# Patient Record
Sex: Female | Born: 1990 | Race: Black or African American | Hispanic: No | Marital: Single | Smoking: Never smoker
Health system: Southern US, Community
[De-identification: ages and names within clinical notes are randomized; demographics above are authoritative.]

## PROBLEM LIST (undated history)

## (undated) DIAGNOSIS — D649 Anemia, unspecified: Secondary | ICD-10-CM

## (undated) DIAGNOSIS — T7840XA Allergy, unspecified, initial encounter: Secondary | ICD-10-CM

## (undated) DIAGNOSIS — L309 Dermatitis, unspecified: Secondary | ICD-10-CM

## (undated) DIAGNOSIS — K589 Irritable bowel syndrome without diarrhea: Secondary | ICD-10-CM

## (undated) HISTORY — DX: Allergy, unspecified, initial encounter: T78.40XA

## (undated) HISTORY — DX: Irritable bowel syndrome, unspecified: K58.9

## (undated) HISTORY — PX: COLONOSCOPY: SHX174

## (undated) HISTORY — PX: WISDOM TOOTH EXTRACTION: SHX21

## (undated) HISTORY — DX: Anemia, unspecified: D64.9

## (undated) HISTORY — DX: Dermatitis, unspecified: L30.9

---
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
## 2013-02-21 ENCOUNTER — Encounter (HOSPITAL_BASED_OUTPATIENT_CLINIC_OR_DEPARTMENT_OTHER): Payer: Self-pay | Admitting: *Deleted

## 2013-02-21 ENCOUNTER — Emergency Department (HOSPITAL_BASED_OUTPATIENT_CLINIC_OR_DEPARTMENT_OTHER)
Admission: EM | Admit: 2013-02-21 | Discharge: 2013-02-21 | Disposition: A | Discharge status: Home / Self Care | Payer: No Typology Code available for payment source | Attending: Emergency Medicine | Admitting: Emergency Medicine

## 2013-02-21 ENCOUNTER — Emergency Department (HOSPITAL_BASED_OUTPATIENT_CLINIC_OR_DEPARTMENT_OTHER): Payer: No Typology Code available for payment source

## 2013-02-21 DIAGNOSIS — Y92410 Unspecified street and highway as the place of occurrence of the external cause: Secondary | ICD-10-CM | POA: Insufficient documentation

## 2013-02-21 DIAGNOSIS — S239XXA Sprain of unspecified parts of thorax, initial encounter: Secondary | ICD-10-CM | POA: Insufficient documentation

## 2013-02-21 DIAGNOSIS — Z79899 Other long term (current) drug therapy: Secondary | ICD-10-CM | POA: Insufficient documentation

## 2013-02-21 DIAGNOSIS — S29019A Strain of muscle and tendon of unspecified wall of thorax, initial encounter: Secondary | ICD-10-CM

## 2013-02-21 DIAGNOSIS — Y9389 Activity, other specified: Secondary | ICD-10-CM | POA: Insufficient documentation

## 2013-02-21 MED ORDER — HYDROMORPHONE HCL PF 1 MG/ML IJ SOLN
1.0000 mg | Freq: Once | INTRAMUSCULAR | Status: AC
  Administered 2013-02-21: 1 mg via INTRAVENOUS
  Filled 2013-02-21: qty 1

## 2013-02-21 MED ORDER — SODIUM CHLORIDE 0.9 % IV SOLN
INTRAVENOUS | Status: DC
  Administered 2013-02-21: 13:00:00 via INTRAVENOUS

## 2013-02-21 MED ORDER — OXYCODONE-ACETAMINOPHEN 5-325 MG PO TABS: 1.0000 | ORAL_TABLET | ORAL | Status: DC | PRN
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
## 2017-08-27 DIAGNOSIS — Z01419 Encounter for gynecological examination (general) (routine) without abnormal findings: Secondary | ICD-10-CM | POA: Diagnosis not present

## 2017-09-23 DIAGNOSIS — Z202 Contact with and (suspected) exposure to infections with a predominantly sexual mode of transmission: Secondary | ICD-10-CM | POA: Diagnosis not present

## 2017-09-28 DIAGNOSIS — A549 Gonococcal infection, unspecified: Secondary | ICD-10-CM | POA: Diagnosis not present

## 2017-10-26 DIAGNOSIS — Z202 Contact with and (suspected) exposure to infections with a predominantly sexual mode of transmission: Secondary | ICD-10-CM | POA: Diagnosis not present

## 2017-10-26 DIAGNOSIS — A549 Gonococcal infection, unspecified: Secondary | ICD-10-CM | POA: Diagnosis not present

## 2018-01-21 DIAGNOSIS — K5909 Other constipation: Secondary | ICD-10-CM | POA: Diagnosis not present

## 2018-01-21 DIAGNOSIS — R5383 Other fatigue: Secondary | ICD-10-CM | POA: Diagnosis not present

## 2018-01-21 DIAGNOSIS — R1031 Right lower quadrant pain: Secondary | ICD-10-CM | POA: Diagnosis not present

## 2018-01-21 DIAGNOSIS — R1032 Left lower quadrant pain: Secondary | ICD-10-CM | POA: Diagnosis not present

## 2018-01-22 DIAGNOSIS — K5909 Other constipation: Secondary | ICD-10-CM | POA: Diagnosis not present

## 2018-01-22 DIAGNOSIS — R5383 Other fatigue: Secondary | ICD-10-CM | POA: Diagnosis not present

## 2018-02-17 DIAGNOSIS — K5900 Constipation, unspecified: Secondary | ICD-10-CM | POA: Diagnosis not present

## 2018-02-17 DIAGNOSIS — K6289 Other specified diseases of anus and rectum: Secondary | ICD-10-CM | POA: Diagnosis not present

## 2018-02-17 DIAGNOSIS — D509 Iron deficiency anemia, unspecified: Secondary | ICD-10-CM | POA: Diagnosis not present

## 2018-02-17 DIAGNOSIS — K648 Other hemorrhoids: Secondary | ICD-10-CM | POA: Diagnosis not present

## 2018-02-17 DIAGNOSIS — K449 Diaphragmatic hernia without obstruction or gangrene: Secondary | ICD-10-CM | POA: Diagnosis not present

## 2018-02-17 DIAGNOSIS — K5909 Other constipation: Secondary | ICD-10-CM | POA: Diagnosis not present

## 2018-02-17 DIAGNOSIS — K639 Disease of intestine, unspecified: Secondary | ICD-10-CM | POA: Diagnosis not present

## 2018-02-17 DIAGNOSIS — R1030 Lower abdominal pain, unspecified: Secondary | ICD-10-CM | POA: Diagnosis not present

## 2018-02-24 DIAGNOSIS — Z30013 Encounter for initial prescription of injectable contraceptive: Secondary | ICD-10-CM | POA: Diagnosis not present

## 2018-02-24 DIAGNOSIS — N840 Polyp of corpus uteri: Secondary | ICD-10-CM | POA: Diagnosis not present

## 2018-02-24 DIAGNOSIS — N921 Excessive and frequent menstruation with irregular cycle: Secondary | ICD-10-CM | POA: Diagnosis not present

## 2018-02-24 DIAGNOSIS — N760 Acute vaginitis: Secondary | ICD-10-CM | POA: Diagnosis not present

## 2018-02-24 DIAGNOSIS — D500 Iron deficiency anemia secondary to blood loss (chronic): Secondary | ICD-10-CM | POA: Diagnosis not present

## 2018-02-24 DIAGNOSIS — Z30432 Encounter for removal of intrauterine contraceptive device: Secondary | ICD-10-CM | POA: Diagnosis not present

## 2018-02-24 DIAGNOSIS — B9689 Other specified bacterial agents as the cause of diseases classified elsewhere: Secondary | ICD-10-CM | POA: Diagnosis not present

## 2018-02-24 DIAGNOSIS — Z3202 Encounter for pregnancy test, result negative: Secondary | ICD-10-CM | POA: Diagnosis not present

## 2018-02-24 DIAGNOSIS — Z113 Encounter for screening for infections with a predominantly sexual mode of transmission: Secondary | ICD-10-CM | POA: Diagnosis not present

## 2018-02-24 DIAGNOSIS — T8332XA Displacement of intrauterine contraceptive device, initial encounter: Secondary | ICD-10-CM | POA: Diagnosis not present

## 2018-03-30 ENCOUNTER — Ambulatory Visit: Payer: BLUE CROSS/BLUE SHIELD | Admitting: Family Medicine

## 2018-03-30 ENCOUNTER — Encounter: Payer: Self-pay | Admitting: Family Medicine

## 2018-03-30 VITALS — BP 106/60 | HR 83 | Resp 16 | Ht 66.0 in | Wt 145.4 lb

## 2018-03-30 DIAGNOSIS — Z9109 Other allergy status, other than to drugs and biological substances: Secondary | ICD-10-CM

## 2018-03-30 DIAGNOSIS — D649 Anemia, unspecified: Secondary | ICD-10-CM | POA: Insufficient documentation

## 2018-03-30 DIAGNOSIS — Z0000 Encounter for general adult medical examination without abnormal findings: Secondary | ICD-10-CM | POA: Diagnosis not present

## 2018-03-30 DIAGNOSIS — D509 Iron deficiency anemia, unspecified: Secondary | ICD-10-CM | POA: Diagnosis not present

## 2018-03-30 DIAGNOSIS — K5900 Constipation, unspecified: Secondary | ICD-10-CM | POA: Insufficient documentation

## 2018-03-30 MED ORDER — FERROUS FUMARATE-FOLIC ACID 324-1 MG PO TABS: 1.0000 | ORAL_TABLET | Freq: Every day | ORAL | 3 refills | Status: DC

## 2018-03-30 MED ORDER — MONTELUKAST SODIUM 10 MG PO TABS: 10.0000 mg | ORAL_TABLET | Freq: Every day | ORAL | 3 refills | Status: DC

## 2018-03-30 NOTE — Patient Instructions (Addendum)
Miralax with Benefiber once or twice daily for constipation  Encouraged increased hydration and fiber in diet. Daily probiotics. If bowels not moving can use MOM 2 tbls po in 4 oz of warm prune juice by mouth every 2-3 days. If no results then repeat in 4 hours with  Dulcolax suppository pr, may repeat again in 4 more hours as needed. Seek care if symptoms worsen. Consider daily   [employer] for cast iron Patient seen with and examined with student.  Agree with documentation See separate note for further documentation Preventive Care 18-39 Years, Female Preventive care refers to lifestyle choices and visits with your health care provider that can promote health and wellness. What does preventive care include?  A yearly physical exam. This is also called an annual well check.  Dental exams once or twice a year.  Routine eye exams. Ask your health care provider how often you should have your eyes checked.  Personal lifestyle choices, including: ? Daily care of your teeth and gums. ? Regular physical activity. ? Eating a healthy diet. ? Avoiding tobacco and drug use. ? Limiting alcohol use. ? Practicing safe sex. ? Taking vitamin and mineral supplements as recommended by your health care provider. What happens during an annual well check? The services and screenings done by your health care provider during your annual well check will depend on your age, overall health, lifestyle risk factors, and family history of disease. Counseling Your health care provider may ask you questions about your:  Alcohol use.  Tobacco use.  Drug use.  Emotional well-being.  Home and relationship well-being.  Sexual activity.  Eating habits.  Work and work Statistician.  Method of birth control.  Menstrual cycle.  Pregnancy history.  Screening You may have the following tests or measurements:  Height, weight, and BMI.  Diabetes screening. This is done by checking your blood sugar  (glucose) after you have not eaten for a while (fasting).  Blood pressure.  Lipid and cholesterol levels. These may be checked every 5 years starting at age 13.  Skin check.  Hepatitis C blood test.  Hepatitis B blood test.  Sexually transmitted disease (STD) testing.  BRCA-related cancer screening. This may be done if you have a family history of breast, ovarian, tubal, or peritoneal cancers.  Pelvic exam and Pap test. This may be done every 3 years starting at age 67. Starting at age 55, this may be done every 5 years if you have a Pap test in combination with an HPV test.  Discuss your test results, treatment options, and if necessary, the need for more tests with your health care provider. Vaccines Your health care provider may recommend certain vaccines, such as:  Influenza vaccine. This is recommended every year.  Tetanus, diphtheria, and acellular pertussis (Tdap, Td) vaccine. You may need a Td booster every 10 years.  Varicella vaccine. You may need this if you have not been vaccinated.  HPV vaccine. If you are 49 or younger, you may need three doses over 6 months.  Measles, mumps, and rubella (MMR) vaccine. You may need at least one dose of MMR. You may also need a second dose.  Pneumococcal 13-valent conjugate (PCV13) vaccine. You may need this if you have certain conditions and were not previously vaccinated.  Pneumococcal polysaccharide (PPSV23) vaccine. You may need one or two doses if you smoke cigarettes or if you have certain conditions.  Meningococcal vaccine. One dose is recommended if you are age 73-21 years and a  first-year college student living in a residence hall, or if you have one of several medical conditions. You may also need additional booster doses.  Hepatitis A vaccine. You may need this if you have certain conditions or if you travel or work in places where you may be exposed to hepatitis A.  Hepatitis B vaccine. You may need this if you have  certain conditions or if you travel or work in places where you may be exposed to hepatitis B.  Haemophilus influenzae type b (Hib) vaccine. You may need this if you have certain risk factors.  Talk to your health care provider about which screenings and vaccines you need and how often you need them. This information is not intended to replace advice given to you by your health care provider. Make sure you discuss any questions you have with your health care provider. Document Released: 02/03/2002 Document Revised: 08/27/2016 Document Reviewed: 10/09/2015 Elsevier Interactive Patient Education  2018 [place] and/or Dulcolax if symptoms persist.

## 2018-03-30 NOTE — Assessment & Plan Note (Signed)
Chronic symptoms taking Claritin and Flonase. Can increase to twice daily, and/or change to Zyrtec, Allegra, Xyzal

## 2018-03-30 NOTE — Assessment & Plan Note (Addendum)
Improving, follows with OB/GYN care and has had a Depo Provera shot, start on Hemocyte F 1 tab

## 2018-04-03 DIAGNOSIS — Z0000 Encounter for general adult medical examination without abnormal findings: Secondary | ICD-10-CM | POA: Insufficient documentation

## 2018-04-03 NOTE — Assessment & Plan Note (Signed)
Encouraged increased hydration and fiber in diet. Daily probiotics. If bowels not moving can use MOM 2 tbls po in 4 oz of warm prune juice by mouth every 2-3 days. If no results then repeat in 4 hours with  Dulcolax suppository pr, may repeat again in 4 more hours as needed. Seek care if symptoms worsen. Consider daily Miralax and/or Dulcolax if symptoms persist.  

## 2018-04-03 NOTE — Progress Notes (Signed)
Patient ID: Kylie Lane, female   DOB: 1991-04-29, 27 y.o.   MRN: [id number]   Subjective:    Patient ID: Kylie Lane, female    DOB: [birth year], 27 y.o.   MRN: [id number]  Chief Complaint  Patient presents with  . New Patient (Initial Visit)    low iron, polyp on uterus  . Annual Exam    HPI Patient is in today for new patient appoinment. She has been struggling with significant anemia and has undergone endoscopy which was unremarkable and a GYN work up. No endometriosis or fibroids were identified. She was given a shot of Depo Provera and has only had one cycle which was only spotting. She was having very heavy bleeding before changing pads every hour for several days each cycle. She notes some ftigue but otherwise feels well today. Denies CP/palp/SOB/H/fevers or GU c/o. She notes some trouble with constipation and having to strain every few days. Notes some recent flare in head congestion as well due to spring allergies.  Past Medical History:  Diagnosis Date  . Allergy   . Anemia     Past Surgical History:  Procedure Laterality Date  . COLONOSCOPY    . WISDOM TOOTH EXTRACTION      Family History  Problem Relation Age of Onset  . Hyperlipidemia Mother   . Hypertension Mother   . Arthritis Maternal Grandmother   . Hyperlipidemia Maternal Grandmother   . Stroke Maternal Grandmother   . Hypertension Paternal Grandmother   . Constipation Father     Social History   Socioeconomic History  . Marital status: Single    Spouse name: Not on file  . Number of children: Not on file  . Years of education: Not on file  . Highest education level: Not on file  Occupational History  . Not on file  Social Needs  . Financial resource strain: Not on file  . Food insecurity:    Worry: Not on file    Inability: Not on file  . Transportation needs:    Medical: Not on file    Non-medical: Not on file  Tobacco Use  . Smoking status: Never Smoker  . Smokeless tobacco: Never  Used  Substance and Sexual Activity  . Alcohol use: Not on file  . Drug use: Never  . Sexual activity: Not on file  Lifestyle  . Physical activity:    Days per week: Not on file    Minutes per session: Not on file  . Stress: Not on file  Relationships  . Social connections:    Talks on phone: Not on file    Gets together: Not on file    Attends religious service: Not on file    Active member of club or organization: Not on file    Attends meetings of clubs or organizations: Not on file    Relationship status: Not on file  . Intimate partner violence:    Fear of current or ex partner: Not on file    Emotionally abused: Not on file    Physically abused: Not on file    Forced sexual activity: Not on file  Other Topics Concern  . Not on file  Social History Narrative   Lives with mother, works [employer], material tech,heart healthy diet, busy work, no regular exercise routine, wears seat belt, no tobacco, single     Outpatient Medications Prior to Visit  Medication Sig Dispense Refill  . fluticasone (FLONASE) 50 MCG/ACT nasal spray Place 1  spray into both nostrils daily.    Marland Kitchen loratadine (CLARITIN) 10 MG tablet Take 10 mg by mouth daily.    . medroxyPROGESTERone (DEPO-PROVERA) 150 MG/ML injection Inject into the muscle.    . Multiple Vitamin (MULTIVITAMIN) tablet Take 1 tablet by mouth daily.    . Polyethylene Glycol 3350 (MIRALAX PO) Take by mouth daily.    Marland Kitchen UNABLE TO FIND Med Name: Ferrofood bid    . fexofenadine (ALLEGRA) 180 MG tablet Take 180 mg by mouth daily.    Marland Kitchen oxyCODONE-acetaminophen (PERCOCET/ROXICET) 5-325 MG per tablet Take 1 tablet by mouth every 4 (four) hours as needed for pain. 15 tablet 0   No facility-administered medications prior to visit.     No Known Allergies  Review of Systems  Constitutional: Positive for malaise/fatigue. Negative for chills and fever.  HENT: Positive for congestion. Negative for hearing loss.   Eyes: Negative for discharge.    Respiratory: Negative for cough, sputum production and shortness of breath.   Cardiovascular: Negative for chest pain, palpitations and leg swelling.  Gastrointestinal: Positive for constipation. Negative for abdominal pain, blood in stool, diarrhea, heartburn, nausea and vomiting.  Genitourinary: Negative for dysuria, frequency, hematuria and urgency.  Musculoskeletal: Negative for back pain, falls and myalgias.  Skin: Negative for rash.  Neurological: Negative for dizziness, sensory change, loss of consciousness, weakness and headaches.  Endo/Heme/Allergies: Negative for environmental allergies. Does not bruise/bleed easily.  Psychiatric/Behavioral: Negative for depression and suicidal ideas. The patient is not nervous/anxious and does not have insomnia.        Objective:    Physical Exam  Constitutional: She is oriented to person, place, and time. No distress.  HENT:  Head: Normocephalic and atraumatic.  Right Ear: External ear normal.  Left Ear: External ear normal.  Nose: Nose normal.  Mouth/Throat: Oropharynx is clear and moist. No oropharyngeal exudate.  Eyes: Pupils are equal, round, and reactive to light. Conjunctivae are normal. Right eye exhibits no discharge. Left eye exhibits no discharge. No scleral icterus.  Neck: Normal range of motion. Neck supple. No thyromegaly present.  Cardiovascular: Normal rate, regular rhythm, normal heart sounds and intact distal pulses.  No murmur heard. Pulmonary/Chest: Effort normal and breath sounds normal. No respiratory distress. She has no wheezes. She has no rales.  Abdominal: Soft. Bowel sounds are normal. She exhibits no distension and no mass. There is no tenderness.  Musculoskeletal: Normal range of motion. She exhibits no edema or tenderness.  Lymphadenopathy:    She has no cervical adenopathy.  Neurological: She is alert and oriented to person, place, and time. She has normal reflexes. She displays normal reflexes. No cranial  nerve deficit. Coordination normal.  Skin: Skin is warm and dry. No rash noted. She is not diaphoretic.    BP 106/60 (BP Location: Right Arm, Patient Position: Sitting, Cuff Size: Normal)   Pulse 83   Resp 16   Ht 5\' 6"  (1.676 m)   Wt 145 lb 6.4 oz (66 kg)   SpO2 95%   BMI 23.47 kg/m         Assessment & Plan:   Problem List Items Addressed This Visit    Constipation    Encouraged increased hydration and fiber in diet. Daily probiotics. If bowels not moving can use MOM 2 tbls po in 4 oz of warm prune juice by mouth every 2-3 days. If no results then repeat in 4 hours with  Dulcolax suppository pr, may repeat again in 4 more hours as needed. Seek  care if symptoms worsen. Consider daily Miralax and/or Dulcolax if symptoms persist.       Environmental allergies    Chronic symptoms taking Claritin and Flonase. Can increase to twice daily, and/or change to Zyrtec, Allegra, Xyzal       Anemia    Improving, follows with OB/GYN care and has had a Depo Provera shot, start on Hemocyte F 1 tab       Relevant Medications   Ferrous Fumarate-Folic Acid (HEMOCYTE-F) 324-1 MG TABS   Other Relevant Orders   CBC   Preventative health care - Primary   Relevant Orders   Comprehensive metabolic panel   Lipid panel   TSH      I have discontinued Aurore L. Macdowell's fexofenadine and oxyCODONE-acetaminophen. I am also having her start on montelukast and Ferrous Fumarate-Folic Acid. Additionally, I am having her maintain her medroxyPROGESTERone, UNABLE TO FIND, multivitamin, Polyethylene Glycol 3350 (MIRALAX PO), fluticasone, and loratadine.  Meds ordered this encounter  Medications  . montelukast (SINGULAIR) 10 MG tablet    Sig: Take 1 tablet (10 mg total) by mouth at bedtime.    Dispense:  30 tablet    Refill:  3  . Ferrous Fumarate-Folic Acid (HEMOCYTE-F) 324-1 MG TABS    Sig: Take 1 tablet by mouth daily.    Dispense:  30 each    Refill:  3   CMA served as scribe during this  visit. History, Physical and Plan performed by medical provider. Documentation and orders reviewed and attested to.  Danise EdgeStacey Clotilde Loth, MD

## 2018-04-05 ENCOUNTER — Other Ambulatory Visit: Payer: BLUE CROSS/BLUE SHIELD

## 2018-04-23 ENCOUNTER — Other Ambulatory Visit (INDEPENDENT_AMBULATORY_CARE_PROVIDER_SITE_OTHER): Payer: BLUE CROSS/BLUE SHIELD

## 2018-04-23 DIAGNOSIS — D649 Anemia, unspecified: Secondary | ICD-10-CM

## 2018-04-23 DIAGNOSIS — K625 Hemorrhage of anus and rectum: Secondary | ICD-10-CM

## 2018-04-23 DIAGNOSIS — Z0000 Encounter for general adult medical examination without abnormal findings: Secondary | ICD-10-CM | POA: Diagnosis not present

## 2018-04-23 LAB — CBC
HEMATOCRIT: 32.4 % — AB (ref 35.0–45.0)
HEMOGLOBIN: 10.4 g/dL — AB (ref 11.7–15.5)
MCH: 25.9 pg — AB (ref 27.0–33.0)
MCHC: 32.1 g/dL (ref 32.0–36.0)
MCV: 80.6 fL (ref 80.0–100.0)
MPV: 10.5 fL (ref 7.5–12.5)
Platelets: 284 10*3/uL (ref 140–400)
RBC: 4.02 10*6/uL (ref 3.80–5.10)
RDW: 21.6 % — ABNORMAL HIGH (ref 11.0–15.0)
WBC: 5.6 10*3/uL (ref 3.8–10.8)

## 2018-04-23 LAB — TSH: TSH: 0.65 mIU/L

## 2018-04-24 LAB — COMPREHENSIVE METABOLIC PANEL
AG Ratio: 1.6 (calc) (ref 1.0–2.5)
ALBUMIN MSPROF: 4.4 g/dL (ref 3.6–5.1)
ALKALINE PHOSPHATASE (APISO): 36 U/L (ref 33–115)
ALT: 6 U/L (ref 6–29)
AST: 11 U/L (ref 10–30)
BUN: 8 mg/dL (ref 7–25)
CALCIUM: 9.2 mg/dL (ref 8.6–10.2)
CO2: 23 mmol/L (ref 20–32)
Chloride: 108 mmol/L (ref 98–110)
Creat: 0.65 mg/dL (ref 0.50–1.10)
Globulin: 2.8 g/dL (calc) (ref 1.9–3.7)
Glucose, Bld: 83 mg/dL (ref 65–99)
POTASSIUM: 3.8 mmol/L (ref 3.5–5.3)
Sodium: 139 mmol/L (ref 135–146)
Total Bilirubin: 0.3 mg/dL (ref 0.2–1.2)
Total Protein: 7.2 g/dL (ref 6.1–8.1)

## 2018-04-24 LAB — LIPID PANEL
CHOL/HDL RATIO: 2.6 (calc) (ref <5.0)
Cholesterol: 153 mg/dL (ref <200)
HDL: 59 mg/dL (ref >50)
LDL CHOLESTEROL (CALC): 83 mg/dL
Non-HDL Cholesterol (Calc): 94 mg/dL (calc) (ref <130)
Triglycerides: 39 mg/dL (ref <150)

## 2018-04-27 NOTE — Progress Notes (Signed)
Ordered ifob cards sent to patient

## 2018-04-27 NOTE — Addendum Note (Signed)
Addended by: Crissie Sickles A on: 04/27/2018 07:30 AM   Modules accepted: Orders

## 2018-05-06 DIAGNOSIS — K5909 Other constipation: Secondary | ICD-10-CM | POA: Diagnosis not present

## 2018-06-07 DIAGNOSIS — Z3202 Encounter for pregnancy test, result negative: Secondary | ICD-10-CM | POA: Diagnosis not present

## 2018-06-07 DIAGNOSIS — Z3042 Encounter for surveillance of injectable contraceptive: Secondary | ICD-10-CM | POA: Diagnosis not present

## 2018-06-17 DIAGNOSIS — Z114 Encounter for screening for human immunodeficiency virus [HIV]: Secondary | ICD-10-CM | POA: Diagnosis not present

## 2018-06-17 DIAGNOSIS — Z01419 Encounter for gynecological examination (general) (routine) without abnormal findings: Secondary | ICD-10-CM | POA: Diagnosis not present

## 2018-06-17 DIAGNOSIS — Z01411 Encounter for gynecological examination (general) (routine) with abnormal findings: Secondary | ICD-10-CM | POA: Diagnosis not present

## 2018-06-17 DIAGNOSIS — Z1151 Encounter for screening for human papillomavirus (HPV): Secondary | ICD-10-CM | POA: Diagnosis not present

## 2018-06-17 DIAGNOSIS — N840 Polyp of corpus uteri: Secondary | ICD-10-CM | POA: Diagnosis not present

## 2018-06-17 DIAGNOSIS — Z3042 Encounter for surveillance of injectable contraceptive: Secondary | ICD-10-CM | POA: Diagnosis not present

## 2018-07-05 ENCOUNTER — Ambulatory Visit: Payer: BLUE CROSS/BLUE SHIELD | Admitting: Family Medicine

## 2018-07-12 ENCOUNTER — Ambulatory Visit: Payer: BLUE CROSS/BLUE SHIELD | Admitting: Family Medicine

## 2018-07-12 ENCOUNTER — Encounter: Payer: Self-pay | Admitting: Family Medicine

## 2018-07-12 VITALS — BP 96/60 | HR 93 | Temp 98.6°F | Resp 18 | Ht 66.0 in | Wt 145.6 lb

## 2018-07-12 DIAGNOSIS — K5900 Constipation, unspecified: Secondary | ICD-10-CM

## 2018-07-12 DIAGNOSIS — D509 Iron deficiency anemia, unspecified: Secondary | ICD-10-CM

## 2018-07-12 NOTE — Patient Instructions (Signed)
Anemia Anemia is a condition in which you do not have enough red blood cells or hemoglobin. Hemoglobin is a substance in red blood cells that carries oxygen. When you do not have enough red blood cells or hemoglobin (are anemic), your body cannot get enough oxygen and your organs may not work properly. As a result, you may feel very tired or have other problems. What are the causes? Common causes of anemia include:  Excessive bleeding. Anemia can be caused by excessive bleeding inside or outside the body, including bleeding from the intestine or from periods in women.  Poor nutrition.  Long-lasting (chronic) kidney, thyroid, and liver disease.  Bone marrow disorders.  Cancer and treatments for cancer.  HIV (human immunodeficiency virus) and AIDS (acquired immunodeficiency syndrome).  Treatments for HIV and AIDS.  Spleen problems.  Blood disorders.  Infections, medicines, and autoimmune disorders that destroy red blood cells.  What are the signs or symptoms? Symptoms of this condition include:  Minor weakness.  Dizziness.  Headache.  Feeling heartbeats that are irregular or faster than normal (palpitations).  Shortness of breath, especially with exercise.  Paleness.  Cold sensitivity.  Indigestion.  Nausea.  Difficulty sleeping.  Difficulty concentrating.  Symptoms may occur suddenly or develop slowly. If your anemia is mild, you may not have symptoms. How is this diagnosed? This condition is diagnosed based on:  Blood tests.  Your medical history.  A physical exam.  Bone marrow biopsy.  Your health care provider may also check your stool (feces) for blood and may do additional testing to look for the cause of your bleeding. You may also have other tests, including:  Imaging tests, such as a CT scan or MRI.  Endoscopy.  Colonoscopy.  How is this treated? Treatment for this condition depends on the cause. If you continue to lose a lot of blood,  you may need to be treated at a hospital. Treatment may include:  Taking supplements of iron, vitamin B12, or folic acid.  Taking a hormone medicine (erythropoietin) that can help to stimulate red blood cell growth.  Having a blood transfusion. This may be needed if you lose a lot of blood.  Making changes to your diet.  Having surgery to remove your spleen.  Follow these instructions at home:  Take over-the-counter and prescription medicines only as told by your health care provider.  Take supplements only as told by your health care provider.  Follow any diet instructions that you were given.  Keep all follow-up visits as told by your health care provider. This is important. Contact a health care provider if:  You develop new bleeding anywhere in the body. Get help right away if:  You are very weak.  You are short of breath.  You have pain in your abdomen or chest.  You are dizzy or feel faint.  You have trouble concentrating.  You have bloody or black, tarry stools.  You vomit repeatedly or you vomit up blood. Summary  Anemia is a condition in which you do not have enough red blood cells or enough of a substance in your red blood cells that carries oxygen (hemoglobin).  Symptoms may occur suddenly or develop slowly.  If your anemia is mild, you may not have symptoms.  This condition is diagnosed with blood tests as well as a medical history and physical exam. Other tests may be needed.  Treatment for this condition depends on the cause of the anemia. This information is not intended to replace advice   given to you by your health care provider. Make sure you discuss any questions you have with your health care provider. Document Released: 01/15/2005 Document Revised: 01/09/2017 Document Reviewed: 01/09/2017 Elsevier Interactive Patient Education  [employer].

## 2018-07-12 NOTE — Progress Notes (Signed)
Subjective:  I acted as a Neurosurgeon for Dr. Abner Greenspan. [place], [place]  Patient ID: Kylie Lane, female    DOB: 03-18-1991, 27 y.o.   MRN: [id number]  No chief complaint on file.   HPI  Patient is in today for a 3 month follow up she has no acute concerns. No recent febrile illness or acute hospitalizations. Denies CP/palp/SOB/HA/congestion/fevers/GI or GU c/o. Taking meds as prescribed. She is following with GYN now and her menstrual bleeding is less. She is less tired and continues to work. Her constipation and allergies are well managed at the present time. Denies CP/palp/SOB/HA/congestion/fevers/GI or GU c/o. Taking meds as prescribed   Patient Care Team: Bradd Canary, MD as PCP - General (Family Medicine)   Past Medical History:  Diagnosis Date  . Allergy   . Anemia     Past Surgical History:  Procedure Laterality Date  . COLONOSCOPY    . WISDOM TOOTH EXTRACTION      Family History  Problem Relation Age of Onset  . Hyperlipidemia Mother   . Hypertension Mother   . Arthritis Maternal Grandmother   . Hyperlipidemia Maternal Grandmother   . Stroke Maternal Grandmother   . Hypertension Paternal Grandmother   . Constipation Father     Social History   Socioeconomic History  . Marital status: Single    Spouse name: Not on file  . Number of children: Not on file  . Years of education: Not on file  . Highest education level: Not on file  Occupational History  . Not on file  Social Needs  . Financial resource strain: Not on file  . Food insecurity:    Worry: Not on file    Inability: Not on file  . Transportation needs:    Medical: Not on file    Non-medical: Not on file  Tobacco Use  . Smoking status: Never Smoker  . Smokeless tobacco: Never Used  Substance and Sexual Activity  . Alcohol use: Not on file  . Drug use: Never  . Sexual activity: Not on file  Lifestyle  . Physical activity:    Days per week: Not on file    Minutes per session: Not on file    . Stress: Not on file  Relationships  . Social connections:    Talks on phone: Not on file    Gets together: Not on file    Attends religious service: Not on file    Active member of club or organization: Not on file    Attends meetings of clubs or organizations: Not on file    Relationship status: Not on file  . Intimate partner violence:    Fear of current or ex partner: Not on file    Emotionally abused: Not on file    Physically abused: Not on file    Forced sexual activity: Not on file  Other Topics Concern  . Not on file  Social History Narrative   Lives with mother, works [employer], material tech,heart healthy diet, busy work, no regular exercise routine, wears seat belt, no tobacco, single     Outpatient Medications Prior to Visit  Medication Sig Dispense Refill  . Ferrous Fumarate-Folic Acid (HEMOCYTE-F) 324-1 MG TABS Take 1 tablet by mouth daily. 30 each 3  . loratadine (CLARITIN) 10 MG tablet Take 10 mg by mouth daily.    . medroxyPROGESTERone (DEPO-PROVERA) 150 MG/ML injection Inject into the muscle.    . montelukast (SINGULAIR) 10 MG tablet Take 1 tablet (  10 mg total) by mouth at bedtime. 30 tablet 3  . Multiple Vitamin (MULTIVITAMIN) tablet Take 1 tablet by mouth daily.    . Polyethylene Glycol 3350 (MIRALAX PO) Take by mouth daily.    . Probiotic Product (PROBIOTIC ADVANCED PO) Take by mouth.    Marland Kitchen UNABLE TO FIND Med Name: Ferrofood bid    . fluticasone (FLONASE) 50 MCG/ACT nasal spray Place 1 spray into both nostrils daily.     No facility-administered medications prior to visit.     No Known Allergies  Review of Systems  Constitutional: Negative for fever and malaise/fatigue.  HENT: Negative for congestion.   Eyes: Negative for blurred vision.  Respiratory: Negative for cough and shortness of breath.   Cardiovascular: Negative for chest pain, palpitations and leg swelling.  Gastrointestinal: Negative for vomiting.  Musculoskeletal: Negative for back  pain.  Skin: Negative for rash.  Neurological: Negative for loss of consciousness and headaches.       Objective:    Physical Exam  Constitutional: She is oriented to person, place, and time. She appears well-developed and well-nourished. No distress.  HENT:  Head: Normocephalic and atraumatic.  Eyes: Conjunctivae are normal.  Neck: Normal range of motion. No thyromegaly present.  Cardiovascular: Normal rate and regular rhythm.  Pulmonary/Chest: Effort normal and breath sounds normal. She has no wheezes.  Abdominal: Soft. Bowel sounds are normal. There is no tenderness.  Musculoskeletal: Normal range of motion. She exhibits no edema or deformity.  Neurological: She is alert and oriented to person, place, and time.  Skin: Skin is warm and dry. She is not diaphoretic.  Psychiatric: She has a normal mood and affect.    BP 96/60 (BP Location: Left Arm, Patient Position: Sitting, Cuff Size: Normal)   Pulse 93   Temp 98.6 F (37 C) (Oral)   Resp 18   Ht 5\' 6"  (1.676 m)   Wt 145 lb 9.6 oz (66 kg)   SpO2 98%   BMI 23.50 kg/m  Wt Readings from Last 3 Encounters:  07/12/18 145 lb 9.6 oz (66 kg)  03/30/18 145 lb 6.4 oz (66 kg)  02/21/13 145 lb (65.8 kg) (76 %, Z= 0.70)*   * Growth percentiles are based on CDC (Girls, 2-20 Years) data.   BP Readings from Last 3 Encounters:  07/12/18 96/60  03/30/18 106/60  02/21/13 124/79      There is no immunization history on file for this patient.  Health Maintenance  Topic Date Due  . HIV Screening  07/19/2008  . TETANUS/TDAP  10/07/2017  . INFLUENZA VACCINE  07/22/2018  . PAP SMEAR  02/20/2020    Lab Results  Component Value Date   WBC 5.6 04/23/2018   HGB 10.4 (L) 04/23/2018   HCT 32.4 (L) 04/23/2018   PLT 284 04/23/2018   GLUCOSE 83 04/23/2018   CHOL 153 04/23/2018   TRIG 39 04/23/2018   HDL 59 04/23/2018   LDLCALC 83 04/23/2018   ALT 6 04/23/2018   AST 11 04/23/2018   NA 139 04/23/2018   K 3.8 04/23/2018   CL 108  04/23/2018   CREATININE 0.65 04/23/2018   BUN 8 04/23/2018   CO2 23 04/23/2018   TSH 0.65 04/23/2018    Lab Results  Component Value Date   TSH 0.65 04/23/2018   Lab Results  Component Value Date   WBC 5.6 04/23/2018   HGB 10.4 (L) 04/23/2018   HCT 32.4 (L) 04/23/2018   MCV 80.6 04/23/2018   PLT 284 04/23/2018  Lab Results  Component Value Date   NA 139 04/23/2018   K 3.8 04/23/2018   CO2 23 04/23/2018   GLUCOSE 83 04/23/2018   BUN 8 04/23/2018   CREATININE 0.65 04/23/2018   BILITOT 0.3 04/23/2018   AST 11 04/23/2018   ALT 6 04/23/2018   PROT 7.2 04/23/2018   CALCIUM 9.2 04/23/2018   Lab Results  Component Value Date   CHOL 153 04/23/2018   Lab Results  Component Value Date   HDL 59 04/23/2018   Lab Results  Component Value Date   LDLCALC 83 04/23/2018   Lab Results  Component Value Date   TRIG 39 04/23/2018   Lab Results  Component Value Date   CHOLHDL 2.6 04/23/2018   No results found for: HGBA1C       Assessment & Plan:   Problem List Items Addressed This Visit    None      I have discontinued Quinley L. Salsgiver's fluticasone. I am also having her maintain her medroxyPROGESTERone, UNABLE TO FIND, multivitamin, Polyethylene Glycol 3350 (MIRALAX PO), loratadine, montelukast, Ferrous Fumarate-Folic Acid, and Probiotic Product (PROBIOTIC ADVANCED PO).  No orders of the defined types were placed in this encounter.  CMA served as Neurosurgeonscribe during this visit. History, Physical and Plan performed by medical provider. Documentation and orders reviewed and attested to.  Crissie SicklesPrincess [place], [place]

## 2018-07-12 NOTE — Assessment & Plan Note (Signed)
Encouraged increased hydration and fiber in diet. Daily probiotics. If bowels not moving can use MOM 2 tbls po in 4 oz of warm prune juice by mouth every 2-3 days.  

## 2018-07-12 NOTE — Assessment & Plan Note (Signed)
Is feeling much better and less tired. Continues to take iron daily and is established with GYN now and her bleeding is controlled now. Repeat cbc with diff today

## 2018-07-13 LAB — CBC WITH DIFFERENTIAL/PLATELET
BASOS ABS: 0.1 10*3/uL (ref 0.0–0.1)
Basophils Relative: 0.8 % (ref 0.0–3.0)
Eosinophils Absolute: 0.1 10*3/uL (ref 0.0–0.7)
Eosinophils Relative: 1.6 % (ref 0.0–5.0)
HCT: 35.1 % — ABNORMAL LOW (ref 36.0–46.0)
Hemoglobin: 11.8 g/dL — ABNORMAL LOW (ref 12.0–15.0)
Lymphocytes Relative: 28.2 % (ref 12.0–46.0)
Lymphs Abs: 2 10*3/uL (ref 0.7–4.0)
MCHC: 33.7 g/dL (ref 30.0–36.0)
MCV: 90.5 fl (ref 78.0–100.0)
MONOS PCT: 7.6 % (ref 3.0–12.0)
Monocytes Absolute: 0.5 10*3/uL (ref 0.1–1.0)
NEUTROS PCT: 61.8 % (ref 43.0–77.0)
Neutro Abs: 4.3 10*3/uL (ref 1.4–7.7)
Platelets: 247 10*3/uL (ref 150.0–400.0)
RBC: 3.88 Mil/uL (ref 3.87–5.11)
RDW: 14.4 % (ref 11.5–15.5)
WBC: 6.9 10*3/uL (ref 4.0–10.5)

## 2018-09-07 DIAGNOSIS — N938 Other specified abnormal uterine and vaginal bleeding: Secondary | ICD-10-CM | POA: Diagnosis not present

## 2018-09-07 DIAGNOSIS — N840 Polyp of corpus uteri: Secondary | ICD-10-CM | POA: Diagnosis not present

## 2018-11-16 ENCOUNTER — Ambulatory Visit: Payer: BLUE CROSS/BLUE SHIELD | Admitting: Family Medicine

## 2018-11-16 ENCOUNTER — Encounter: Payer: Self-pay | Admitting: Family Medicine

## 2018-11-16 VITALS — BP 119/65 | HR 85 | Temp 98.4°F | Resp 18 | Wt 153.0 lb

## 2018-11-16 DIAGNOSIS — K5900 Constipation, unspecified: Secondary | ICD-10-CM | POA: Diagnosis not present

## 2018-11-16 DIAGNOSIS — D649 Anemia, unspecified: Secondary | ICD-10-CM | POA: Diagnosis not present

## 2018-11-16 DIAGNOSIS — R109 Unspecified abdominal pain: Secondary | ICD-10-CM

## 2018-11-16 NOTE — Patient Instructions (Addendum)
Encouraged increased hydration and fiber in diet. Daily probiotics. If bowels not moving can use MOM 2 tbls po in 4 oz of warm prune juice by mouth every 2-3 days. If no results then repeat in 4 hours with  Dulcolax suppository pr, may repeat again in 4 more hours as needed. Seek care if symptoms worsen. Consider daily Miralax and/or Dulcolax if symptoms persist.   Miralax with Benefiber once or twice daily   Round up/lyphosate may contribute to gastrointestinal distress. Consider trying to eat non GMO or organic foods.

## 2018-11-17 LAB — CBC WITH DIFFERENTIAL/PLATELET
BASOS ABS: 0.1 10*3/uL (ref 0.0–0.1)
Basophils Relative: 1 % (ref 0.0–3.0)
Eosinophils Absolute: 0.2 10*3/uL (ref 0.0–0.7)
Eosinophils Relative: 3 % (ref 0.0–5.0)
HCT: 33.5 % — ABNORMAL LOW (ref 36.0–46.0)
Hemoglobin: 11.5 g/dL — ABNORMAL LOW (ref 12.0–15.0)
LYMPHS ABS: 2.3 10*3/uL (ref 0.7–4.0)
Lymphocytes Relative: 33.9 % (ref 12.0–46.0)
MCHC: 34.2 g/dL (ref 30.0–36.0)
MCV: 89.8 fl (ref 78.0–100.0)
MONO ABS: 0.4 10*3/uL (ref 0.1–1.0)
Monocytes Relative: 6.5 % (ref 3.0–12.0)
NEUTROS PCT: 55.6 % (ref 43.0–77.0)
Neutro Abs: 3.8 10*3/uL (ref 1.4–7.7)
Platelets: 288 10*3/uL (ref 150.0–400.0)
RBC: 3.73 Mil/uL — AB (ref 3.87–5.11)
RDW: 13 % (ref 11.5–15.5)
WBC: 6.8 10*3/uL (ref 4.0–10.5)

## 2018-11-19 LAB — FOOD ALLERGY PROFILE
ALMONDS: 0.1 kU/L — AB
Allergen, Salmon, f41: <0.1 kU/L
CLASS: 0
CLASS: 0
CLASS: 0
CLASS: 0
CLASS: 1
CLASS: 2
CLASS: 0
CLASS: 0
CLASS: 0
CLASS: 0
CLASS: 1
CLASS: 2
CLASS: 2
Cashew IgE: <0.1 kU/L
Class: 0
Class: 2
Fish Cod: <0.1 kU/L
Hazelnut: 0.21 kU/L — ABNORMAL HIGH
Peanut IgE: 0.82 kU/L — ABNORMAL HIGH
SESAME SEED IGE: 3.41 kU/L — AB
SHRIMP IGE: 1.99 kU/L — AB
Scallop IgE: 0.52 kU/L — ABNORMAL HIGH
Soybean IgE: 0.76 kU/L — ABNORMAL HIGH
Walnut: 0.17 kU/L — ABNORMAL HIGH
Wheat IgE: 0.62 kU/L — ABNORMAL HIGH

## 2018-11-19 LAB — INTERPRETATION:

## 2018-11-22 DIAGNOSIS — R109 Unspecified abdominal pain: Secondary | ICD-10-CM | POA: Insufficient documentation

## 2018-11-22 NOTE — Assessment & Plan Note (Signed)
Intermittent abdominal pain after eating. Will proceed with some food allergy testing. Avoid offending foods and report if symptoms worsen.

## 2018-11-22 NOTE — Progress Notes (Signed)
Subjective:    Patient ID: Kylie Lane, female    DOB: 09-03-1991, 27 y.o.   MRN: [id number]  No chief complaint on file.   HPI Patient is in today for follow-up.  She does report generally being improved but does still endorse some intermittent constipation and abdominal pain.  She had a bad episode of abdominal pain she describes as gas and cramping several weeks ago used some Epsom salt and felt that it improved.  She denies any bloody or tarry stool.  No anorexia or nausea or vomiting.  Otherwise she reports feeling well. Denies CP/palp/SOB/HA/congestion/fevers or GU c/o. Taking meds as prescribed  Past Medical History:  Diagnosis Date  . Allergy   . Anemia     Past Surgical History:  Procedure Laterality Date  . COLONOSCOPY    . WISDOM TOOTH EXTRACTION      Family History  Problem Relation Age of Onset  . Hyperlipidemia Mother   . Hypertension Mother   . Arthritis Maternal Grandmother   . Hyperlipidemia Maternal Grandmother   . Stroke Maternal Grandmother   . Hypertension Paternal Grandmother   . Constipation Father     Social History   Socioeconomic History  . Marital status: Single    Spouse name: Not on file  . Number of children: Not on file  . Years of education: Not on file  . Highest education level: Not on file  Occupational History  . Not on file  Social Needs  . Financial resource strain: Not on file  . Food insecurity:    Worry: Not on file    Inability: Not on file  . Transportation needs:    Medical: Not on file    Non-medical: Not on file  Tobacco Use  . Smoking status: Never Smoker  . Smokeless tobacco: Never Used  Substance and Sexual Activity  . Alcohol use: Not on file  . Drug use: Never  . Sexual activity: Not on file  Lifestyle  . Physical activity:    Days per week: Not on file    Minutes per session: Not on file  . Stress: Not on file  Relationships  . Social connections:    Talks on phone: Not on file    Gets  together: Not on file    Attends religious service: Not on file    Active member of club or organization: Not on file    Attends meetings of clubs or organizations: Not on file    Relationship status: Not on file  . Intimate partner violence:    Fear of current or ex partner: Not on file    Emotionally abused: Not on file    Physically abused: Not on file    Forced sexual activity: Not on file  Other Topics Concern  . 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 loratadine (CLARITIN) 10 MG tablet Take 10 mg by mouth daily.    . montelukast (SINGULAIR) 10 MG tablet Take 1 tablet (10 mg total) by mouth at bedtime. 30 tablet 3  . Multiple Vitamin (MULTIVITAMIN) tablet Take 1 tablet by mouth daily.    . Polyethylene Glycol 3350 (MIRALAX PO) Take by mouth daily.    . Probiotic Product (PROBIOTIC ADVANCED PO) Take by mouth.    .Marland Kitchen  UNABLE TO FIND Med Name: Ferrofood bid    . Ferrous Fumarate-Folic Acid (HEMOCYTE-F) 324-1 MG TABS Take 1 tablet by mouth daily. 30 each 3  . medroxyPROGESTERone (DEPO-PROVERA) 150 MG/ML injection Inject into the muscle.     No facility-administered medications prior to visit.     Allergies  Allergen Reactions  . Shellfish Allergy Anaphylaxis    Review of Systems  Constitutional: Negative for fever and malaise/fatigue.  HENT: Negative for congestion.   Eyes: Negative for blurred vision.  Respiratory: Negative for shortness of breath.   Cardiovascular: Negative for chest pain, palpitations and leg swelling.  Gastrointestinal: Positive for abdominal pain and constipation. Negative for blood in stool, melena, nausea and vomiting.  Genitourinary: Negative for dysuria and frequency.  Musculoskeletal: Negative for falls.  Skin: Negative for  rash.  Neurological: Negative for dizziness, loss of consciousness and headaches.  Endo/Heme/Allergies: Negative for environmental allergies.  Psychiatric/Behavioral: Negative for depression. The patient is not nervous/anxious.        Objective:    Physical Exam  Constitutional: She is oriented to person, place, and time. She appears well-developed and well-nourished. No distress.  HENT:  Head: Normocephalic and atraumatic.  Nose: Nose normal.  Eyes: Right eye exhibits no discharge. Left eye exhibits no discharge.  Neck: Normal range of motion. Neck supple.  Cardiovascular: Normal rate and regular rhythm.  No murmur heard. Pulmonary/Chest: Effort normal and breath sounds normal.  Abdominal: Soft. Bowel sounds are normal. There is no tenderness.  Musculoskeletal: She exhibits no edema.  Neurological: She is alert and oriented to person, place, and time.  Skin: Skin is warm and dry.  Psychiatric: She has a normal mood and affect.  Nursing note and vitals reviewed.   BP 119/65 (BP Location: Left Arm, Patient Position: Sitting, Cuff Size: Normal)   Pulse 85   Temp 98.4 F (36.9 C) (Oral)   Resp 18   Wt 153 lb (69.4 kg)   SpO2 100%   BMI 24.69 kg/m  Wt Readings from Last 3 Encounters:  11/16/18 153 lb (69.4 kg)  07/12/18 145 lb 9.6 oz (66 kg)  03/30/18 145 lb 6.4 oz (66 kg)     Lab Results  Component Value Date   WBC 6.8 11/16/2018   HGB 11.5 (L) 11/16/2018   HCT 33.5 (L) 11/16/2018   PLT 288.0 11/16/2018   GLUCOSE 83 04/23/2018   CHOL 153 04/23/2018   TRIG 39 04/23/2018   HDL 59 04/23/2018   LDLCALC 83 04/23/2018   ALT 6 04/23/2018   AST 11 04/23/2018   NA 139 04/23/2018   K 3.8 04/23/2018   CL 108 04/23/2018   CREATININE 0.65 04/23/2018   BUN 8 04/23/2018   CO2 23 04/23/2018   TSH 0.65 04/23/2018    Lab Results  Component Value Date   TSH 0.65 04/23/2018   Lab Results  Component Value Date   WBC 6.8 11/16/2018   HGB 11.5 (L) 11/16/2018   HCT 33.5  (L) 11/16/2018   MCV 89.8 11/16/2018   PLT 288.0 11/16/2018   Lab Results  Component Value Date   NA 139 04/23/2018   K 3.8 04/23/2018   CO2 23 04/23/2018   GLUCOSE 83 04/23/2018   BUN 8 04/23/2018   CREATININE 0.65 04/23/2018   BILITOT 0.3 04/23/2018   AST 11 04/23/2018   ALT 6 04/23/2018   PROT 7.2 04/23/2018   CALCIUM 9.2 04/23/2018   Lab Results  Component Value Date   CHOL 153 04/23/2018  Lab Results  Component Value Date   HDL 59 04/23/2018   Lab Results  Component Value Date   LDLCALC 83 04/23/2018   Lab Results  Component Value Date   TRIG 39 04/23/2018   Lab Results  Component Value Date   CHOLHDL 2.6 04/23/2018   No results found for: HGBA1C     Assessment & Plan:   Problem List Items Addressed This Visit    Constipation    Encouraged increased hydration and fiber in diet. Daily probiotics. If bowels not moving can use MOM 2 tbls po in 4 oz of warm prune juice by mouth every 2-3 days. If no results then repeat in 4 hours with  Dulcolax suppository pr, may repeat again in 4 more hours as needed. Seek care if symptoms worsen. Consider daily Miralax and/or Dulcolax if symptoms persist.       Anemia    Increase leafy greens, consider increased lean red meat and using cast iron cookware. Continue to monitor, report any concerns. stable      Relevant Orders   CBC w/Diff (Completed)   Abdominal pain - Primary    Intermittent abdominal pain after eating. Will proceed with some food allergy testing. Avoid offending foods and report if symptoms worsen.      Relevant Orders   Food Allergy Profile (Completed)      I have discontinued Dalayla L. Bezek's medroxyPROGESTERone and Ferrous Fumarate-Folic Acid. I am also having her maintain her UNABLE TO FIND, multivitamin, Polyethylene Glycol 3350 (MIRALAX PO), loratadine, montelukast, and Probiotic Product (PROBIOTIC ADVANCED PO).  No orders of the defined types were placed in this encounter.    Danise Edge, MD

## 2018-11-22 NOTE — Assessment & Plan Note (Signed)
Increase leafy greens, consider increased lean red meat and using cast iron cookware. Continue to monitor, report any concerns. stable 

## 2018-11-22 NOTE — Assessment & Plan Note (Signed)
Encouraged increased hydration and fiber in diet. Daily probiotics. If bowels not moving can use MOM 2 tbls po in 4 oz of warm prune juice by mouth every 2-3 days. If no results then repeat in 4 hours with  Dulcolax suppository pr, may repeat again in 4 more hours as needed. Seek care if symptoms worsen. Consider daily Miralax and/or Dulcolax if symptoms persist.  

## 2019-01-26 DIAGNOSIS — M545 Low back pain: Secondary | ICD-10-CM | POA: Diagnosis not present

## 2019-02-07 DIAGNOSIS — M545 Low back pain: Secondary | ICD-10-CM | POA: Diagnosis not present

## 2019-02-09 DIAGNOSIS — M545 Low back pain: Secondary | ICD-10-CM | POA: Diagnosis not present

## 2019-02-20 DIAGNOSIS — H1031 Unspecified acute conjunctivitis, right eye: Secondary | ICD-10-CM | POA: Diagnosis not present

## 2019-04-08 ENCOUNTER — Encounter: Payer: Self-pay | Admitting: Family Medicine

## 2019-04-11 ENCOUNTER — Encounter: Payer: Self-pay | Admitting: Medical

## 2019-04-11 ENCOUNTER — Ambulatory Visit (INDEPENDENT_AMBULATORY_CARE_PROVIDER_SITE_OTHER): Payer: BLUE CROSS/BLUE SHIELD | Admitting: Medical

## 2019-04-11 ENCOUNTER — Other Ambulatory Visit: Payer: Self-pay

## 2019-04-11 DIAGNOSIS — J301 Allergic rhinitis due to pollen: Secondary | ICD-10-CM

## 2019-04-11 MED ORDER — FLUTICASONE PROPIONATE 50 MCG/ACT NA SUSP: 2.0000 | Freq: Every day | NASAL | 2 refills | Status: DC

## 2019-04-11 MED ORDER — MONTELUKAST SODIUM 10 MG PO TABS: 10.0000 mg | ORAL_TABLET | Freq: Every day | ORAL | 3 refills | Status: DC

## 2019-04-11 MED ORDER — LEVOCETIRIZINE DIHYDROCHLORIDE 5 MG PO TABS: 5.0000 mg | ORAL_TABLET | Freq: Every evening | ORAL | 3 refills | Status: DC

## 2019-04-11 NOTE — Progress Notes (Signed)
   Subjective:    Patient ID: Kylie Lane, female    DOB: [birth year], 28 y.o.   MRN: [id number]  HPI  Virtual Visit via Video Note  I connected with Kylie Lane on 04/11/19 at  4:00 PM EDT by a video enabled telemedicine application and verified that I am speaking with the correct person using two identifiers.   I discussed the limitations of evaluation and management by telemedicine and the availability of in person appointments. The patient expressed understanding and agreed to proceed.  History of Present Illness:  Pt states she is nasal congested. She state this year allergies bad. She has sneezing, itching, pnd and itching throat. No wheezing. Pt states in past when younger saw allergist and had some immune therapy. But even though got injections she has allergies worse in the spring.  Pt was on montelukast last year but ran out.  Pt has tried claritin and zyrtec and nothing has helped.  LMP- end of march. Upcoming soon.    Observations/Objective: No acute distress.  Does have nasally congested.  Assessment and Plan: You do appear to have allergic rhinitis flare recently with history of moderate to severe allergy in the past.  Based on recent symptoms and your history think to be beneficial for you to start montelukast, Xyzal and Flonase nasal spray.  If you are not improving by Thursday then might consider Depo-Medrol 40 mg injection or 6-day taper dose of prednisone.  Please send me update by my chart on Thursday.  Sooner if needed.  Follow-up date to be determined depending on response to treatment.  Follow Up Instructions:    I discussed the assessment and treatment plan with the patient. The patient was provided an opportunity to ask questions and all were answered. The patient agreed with the plan and demonstrated an understanding of the instructions.   The patient was advised to call back or seek an in-person evaluation if the symptoms worsen or if the  condition fails to improve as anticipated.     Esperanza Richters, PA-C   Review of Systems  Constitutional: Negative for chills, fatigue and fever.  HENT: Positive for congestion, postnasal drip and sneezing. Negative for ear pain, sinus pressure, sore throat and trouble swallowing.   Respiratory: Negative for cough, chest tightness, shortness of breath and wheezing.   Gastrointestinal: Negative for abdominal pain.  Hematological: Negative for adenopathy. Does not bruise/bleed easily.       Objective:   Physical Exam  No acute distress.  Does sound nasally congested.      Assessment & Plan:

## 2019-04-11 NOTE — Patient Instructions (Signed)
You do appear to have allergic rhinitis flare recently with history of moderate to severe allergy in the past.  Based on recent symptoms and your history think to be beneficial for you to start montelukast, Xyzal and Flonase nasal spray.  If you are not improving by Thursday then might consider Depo-Medrol 40 mg injection or 6-day taper dose of prednisone.  Please send me update by my chart on Thursday.  Sooner if needed.  Follow-up date to be determined depending on response to treatment.

## 2019-04-19 ENCOUNTER — Encounter: Payer: BLUE CROSS/BLUE SHIELD | Admitting: Family Medicine

## 2019-05-01 ENCOUNTER — Encounter: Payer: Self-pay | Admitting: Medical

## 2019-05-02 ENCOUNTER — Telehealth: Payer: Self-pay | Admitting: Medical

## 2019-05-02 MED ORDER — LORATADINE 10 MG PO TABS: 10.0000 mg | ORAL_TABLET | Freq: Every day | ORAL | 3 refills | Status: DC

## 2019-05-02 NOTE — Telephone Encounter (Signed)
Rx claritin sent to pt pharmacy per my chart request

## 2019-07-26 ENCOUNTER — Encounter: Payer: BLUE CROSS/BLUE SHIELD | Admitting: Family Medicine

## 2019-07-26 DIAGNOSIS — Z0289 Encounter for other administrative examinations: Secondary | ICD-10-CM

## 2019-08-15 ENCOUNTER — Encounter: Payer: Self-pay | Admitting: Family Medicine

## 2019-08-25 ENCOUNTER — Encounter: Payer: Self-pay | Admitting: Family Medicine

## 2019-08-31 ENCOUNTER — Other Ambulatory Visit: Payer: Self-pay

## 2019-08-31 ENCOUNTER — Ambulatory Visit (INDEPENDENT_AMBULATORY_CARE_PROVIDER_SITE_OTHER): Payer: BC Managed Care – PPO | Admitting: Family Medicine

## 2019-08-31 VITALS — Wt 150.0 lb

## 2019-08-31 DIAGNOSIS — K5900 Constipation, unspecified: Secondary | ICD-10-CM | POA: Diagnosis not present

## 2019-08-31 DIAGNOSIS — R109 Unspecified abdominal pain: Secondary | ICD-10-CM

## 2019-08-31 DIAGNOSIS — R197 Diarrhea, unspecified: Secondary | ICD-10-CM

## 2019-08-31 MED ORDER — HYOSCYAMINE SULFATE SL 0.125 MG SL SUBL: 1.0000 | SUBLINGUAL_TABLET | Freq: Two times a day (BID) | SUBLINGUAL | 0 refills | Status: DC | PRN

## 2019-08-31 MED ORDER — LUBIPROSTONE 8 MCG PO CAPS: 8.0000 ug | ORAL_CAPSULE | Freq: Two times a day (BID) | ORAL | 1 refills | Status: DC

## 2019-08-31 NOTE — Assessment & Plan Note (Addendum)
She notes her constipation which is now alternating with diarrhea is worsening. She is noting a period of 3-4 days between bowel movements, at the end of the 3 days she will have severe abdominal cramping in the lower abdomen which can last a whole day until she moves her bowels. Often when her bowels begin to move she will have diarrhea with roughly 2 loose stool daily for 1-2 days before constipation starts back. No bloody or tarry stool. No vomiting. She has been using a combination on Miralax and Benefiber daily she is encouraged to hold the Miralax on the days the diarrhea is occurring. She is given some hyoscyamine 0.125 mg tabs to use prn. She is also started on Amitiza 8 mg po bid until she is able to see gastroenterology. Referral is placed to gastroenterology today. Discussed strategies with patient for 15 minutes.

## 2019-08-31 NOTE — Progress Notes (Signed)
Virtual Visit via Video Note  I connected with Kylie Lane on 08/31/19 at  9:40 AM EDT by a video enabled telemedicine application and verified that I am speaking with the correct person using two identifiers.  Location: Patient: work Provider: home   I discussed the limitations of evaluation and management by telemedicine and the availability of in person appointments. The patient expressed understanding and agreed to proceed. Crissie Sickles, CMA was able to get the patient set up on visit, visit   Subjective:    Patient ID: Kylie Lane, female    DOB: 02/25/1991, 28 y.o.   MRN: [id number]  No chief complaint on file.   HPI Patient is in today for evaluation of worsening abdominal pain, constipation alternating with diarrhea. She notes her constipation which is now alternating with diarrhea is worsening. She is noting a period of 3-4 days between bowel movements, at the end of the 3 days she will have severe abdominal cramping in the lower abdomen which can last a whole day until she moves her bowels. Often when her bowels begin to move she will have diarrhea with roughly 2 loose stool daily for 1-2 days before constipation starts back. No bloody or tarry stool. No vomiting. She has been using a combination on Miralax and Benefiber daily. She denies any recent febrile illness or hospitalizations. Denies CP/palp/SOB/HA/congestion/fevers/GI or GU c/o. Taking meds as prescribed  Past Medical History:  Diagnosis Date   Allergy    Anemia     Past Surgical History:  Procedure Laterality Date   COLONOSCOPY     WISDOM TOOTH EXTRACTION      Family History  Problem Relation Age of Onset   Hyperlipidemia Mother    Hypertension Mother    Arthritis Maternal Grandmother    Hyperlipidemia Maternal Grandmother    Stroke Maternal Grandmother    Hypertension Paternal Grandmother    Constipation Father     Social History   Socioeconomic History   Marital status:  Single    Spouse name: Not on file   Number of children: Not on file   Years of education: Not on file   Highest education level: Not on file  Occupational History   Not on file  Social Needs   Financial resource strain: Not on file   Food insecurity    Worry: Not on file    Inability: Not on file   Transportation needs    Medical: Not on file    Non-medical: Not on file  Tobacco Use   Smoking status: Never Smoker   Smokeless tobacco: Never Used  Substance and Sexual Activity   Alcohol use: Not on file   Drug use: Never   Sexual activity: Not on file  Lifestyle   Physical activity    Days per week: Not on file    Minutes per session: Not on file   Stress: Not on file  Relationships   Social connections    Talks on phone: Not on file    Gets together: Not on file    Attends religious service: Not on file    Active member of club or organization: Not on file    Attends meetings of clubs or organizations: Not on file    Relationship status: Not on file   Intimate partner violence    Fear of current or ex partner: Not on file    Emotionally abused: Not on file    Physically abused: Not on file    Forced  sexual activity: Not on file  Other Topics Concern   Not on file  Social History Narrative   Lives with mother, works [employer], material tech,heart healthy diet, busy work, no regular exercise routine, wears seat belt, no tobacco, single     Outpatient Medications Prior to Visit  Medication Sig Dispense Refill   fluticasone (FLONASE) 50 MCG/ACT nasal spray Place 2 sprays into both nostrils daily. 16 g 2   loratadine (CLARITIN) 10 MG tablet Take 1 tablet (10 mg total) by mouth daily. 30 tablet 3   montelukast (SINGULAIR) 10 MG tablet Take 1 tablet (10 mg total) by mouth at bedtime. 30 tablet 3   Multiple Vitamin (MULTIVITAMIN) tablet Take 1 tablet by mouth daily.     Polyethylene Glycol 3350 (MIRALAX PO) Take by mouth daily.     Probiotic  Product (PROBIOTIC ADVANCED PO) Take by mouth.     UNABLE TO FIND Med Name: Ferrofood bid     No facility-administered medications prior to visit.     Allergies  Allergen Reactions   Shellfish Allergy Anaphylaxis    Review of Systems  Constitutional: Negative for fever and malaise/fatigue.  HENT: Negative for congestion.   Eyes: Negative for blurred vision.  Respiratory: Negative for shortness of breath.   Cardiovascular: Negative for chest pain, palpitations and leg swelling.  Gastrointestinal: Positive for abdominal pain, constipation and diarrhea. Negative for blood in stool, melena and vomiting.  Genitourinary: Negative for dysuria and frequency.  Musculoskeletal: Negative for falls.  Skin: Negative for rash.  Neurological: Negative for dizziness, loss of consciousness and headaches.  Endo/Heme/Allergies: Negative for environmental allergies.  Psychiatric/Behavioral: Negative for depression. The patient is not nervous/anxious.        Objective:    Physical Exam Constitutional:      Appearance: Normal appearance. She is not ill-appearing.  HENT:     Head: Normocephalic and atraumatic.  Eyes:     General:        Right eye: No discharge.        Left eye: No discharge.  Pulmonary:     Effort: Pulmonary effort is normal.  Neurological:     Mental Status: She is alert and oriented to person, place, and time.  Psychiatric:        Mood and Affect: Mood normal.        Behavior: Behavior normal.     Wt 150 lb (68 kg)    BMI 24.21 kg/m  Wt Readings from Last 3 Encounters:  08/31/19 150 lb (68 kg)  11/16/18 153 lb (69.4 kg)  07/12/18 145 lb 9.6 oz (66 kg)    Diabetic Foot Exam - Simple   No data filed     Lab Results  Component Value Date   WBC 6.8 11/16/2018   HGB 11.5 (L) 11/16/2018   HCT 33.5 (L) 11/16/2018   PLT 288.0 11/16/2018   GLUCOSE 83 04/23/2018   CHOL 153 04/23/2018   TRIG 39 04/23/2018   HDL 59 04/23/2018   LDLCALC 83 04/23/2018   ALT 6  04/23/2018   AST 11 04/23/2018   NA 139 04/23/2018   K 3.8 04/23/2018   CL 108 04/23/2018   CREATININE 0.65 04/23/2018   BUN 8 04/23/2018   CO2 23 04/23/2018   TSH 0.65 04/23/2018    Lab Results  Component Value Date   TSH 0.65 04/23/2018   Lab Results  Component Value Date   WBC 6.8 11/16/2018   HGB 11.5 (L) 11/16/2018   HCT  33.5 (L) 11/16/2018   MCV 89.8 11/16/2018   PLT 288.0 11/16/2018   Lab Results  Component Value Date   NA 139 04/23/2018   K 3.8 04/23/2018   CO2 23 04/23/2018   GLUCOSE 83 04/23/2018   BUN 8 04/23/2018   CREATININE 0.65 04/23/2018   BILITOT 0.3 04/23/2018   AST 11 04/23/2018   ALT 6 04/23/2018   PROT 7.2 04/23/2018   CALCIUM 9.2 04/23/2018   Lab Results  Component Value Date   CHOL 153 04/23/2018   Lab Results  Component Value Date   HDL 59 04/23/2018   Lab Results  Component Value Date   LDLCALC 83 04/23/2018   Lab Results  Component Value Date   TRIG 39 04/23/2018   Lab Results  Component Value Date   CHOLHDL 2.6 04/23/2018   No results found for: HGBA1C     Assessment & Plan:   Problem List Items Addressed This Visit    Constipation - Primary    She notes her constipation which is now alternating with diarrhea is worsening. She is noting a period of 3-4 days between bowel movements, at the end of the 3 days she will have severe abdominal cramping in the lower abdomen which can last a whole day until she moves her bowels. Often when her bowels begin to move she will have diarrhea with roughly 2 loose stool daily for 1-2 days before constipation starts back. No bloody or tarry stool. No vomiting. She has been using a combination on Miralax and Benefiber daily she is encouraged to hold the Miralax on the days the diarrhea is occurring. She is given some hyoscyamine 0.125 mg tabs to use prn. She is also started on Amitiza 8 mg po bid until she is able to see gastroenterology. Referral is placed to gastroenterology today. Discussed  strategies with patient for 15 minutes.       Relevant Orders   Ambulatory referral to Gastroenterology   Abdominal pain    Is having increasing levels of pain in lower abdomen with the worsening constipation. She will try Hyoscyamine for the pain prn       Other Visit Diagnoses    Diarrhea, unspecified type       Relevant Orders   Ambulatory referral to Gastroenterology      I am having Tatanisha L. Martos start on Hyoscyamine Sulfate SL and lubiprostone. I am also having her maintain her UNABLE TO FIND, multivitamin, Polyethylene Glycol 3350 (MIRALAX PO), Probiotic Product (PROBIOTIC ADVANCED PO), montelukast, fluticasone, and loratadine.  Meds ordered this encounter  Medications   Hyoscyamine Sulfate SL 0.125 MG SUBL    Sig: Place 1 tablet under the tongue 2 (two) times daily as needed.    Dispense:  30 tablet    Refill:  0   lubiprostone (AMITIZA) 8 MCG capsule    Sig: Take 1 capsule (8 mcg total) by mouth 2 (two) times daily with a meal.    Dispense:  60 capsule    Refill:  1     I discussed the assessment and treatment plan with the patient. The patient was provided an opportunity to ask questions and all were answered. The patient agreed with the plan and demonstrated an understanding of the instructions.   The patient was advised to call back or seek an in-person evaluation if the symptoms worsen or if the condition fails to improve as anticipated.  I provided 25 minutes of non-face-to-face time during this encounter.   Penni Homans,  MD

## 2019-08-31 NOTE — Assessment & Plan Note (Signed)
Is having increasing levels of pain in lower abdomen with the worsening constipation. She will try Hyoscyamine for the pain prn

## 2019-09-12 ENCOUNTER — Other Ambulatory Visit: Payer: Self-pay | Admitting: Family Medicine

## 2019-09-13 ENCOUNTER — Encounter: Payer: Self-pay | Admitting: Internal Medicine

## 2019-10-17 ENCOUNTER — Encounter: Payer: Self-pay | Admitting: Internal Medicine

## 2019-10-17 ENCOUNTER — Other Ambulatory Visit: Payer: Self-pay

## 2019-10-17 ENCOUNTER — Ambulatory Visit (INDEPENDENT_AMBULATORY_CARE_PROVIDER_SITE_OTHER): Payer: BC Managed Care – PPO | Admitting: Internal Medicine

## 2019-10-17 VITALS — BP 96/56 | HR 68 | Temp 98.5°F | Ht 66.0 in | Wt 156.2 lb

## 2019-10-17 DIAGNOSIS — K581 Irritable bowel syndrome with constipation: Secondary | ICD-10-CM | POA: Diagnosis not present

## 2019-10-17 DIAGNOSIS — R1084 Generalized abdominal pain: Secondary | ICD-10-CM | POA: Diagnosis not present

## 2019-10-17 DIAGNOSIS — K5900 Constipation, unspecified: Secondary | ICD-10-CM | POA: Diagnosis not present

## 2019-10-17 DIAGNOSIS — R140 Abdominal distension (gaseous): Secondary | ICD-10-CM

## 2019-10-17 MED ORDER — LINACLOTIDE 290 MCG PO CAPS: 290.0000 ug | ORAL_CAPSULE | Freq: Every day | ORAL | 0 refills | Status: DC

## 2019-10-17 NOTE — Patient Instructions (Signed)
We have given you some samples of Linzess 268mcg to try

## 2019-10-17 NOTE — Progress Notes (Addendum)
HISTORY OF PRESENT ILLNESS:  Kylie Lane is a 28 y.o. female, Freight forwarder at [employer], he was sent today by her primary care provider Dr. Randel Pigg regarding problems with abdominal pain and constipation.  She was evaluated by her primary provider August 31, 2019 via a virtual visit.  Patient reports trouble with constipation since childhood.  She has tried various agents with variable results.  She has been thoroughly evaluated by [facility] (Dr. Acquanetta Sit) 2019.  This includes normal colonoscopy and normal upper endoscopy.  Normal duodenal biopsies.  She did have anemia related to menorrhagia for which she saw GYN and was placed on Depo therapy.  Patient tells me that she was tried on low-dose Linzess which did not help.  Typically she will go 3 or 4 days without a bowel movement.  As time goes on she becomes progressively bloated and uncomfortable.  This is relieved with defecation.  She will not experience diarrhea unless she is using laxatives.  She was recently prescribed Amitiza but did not fill this prescription secondary to cost.  She has been on MiraLAX and Benefiber previously.  No longer on iron.  Her appetite is good.  Weight stable.  No family history of relevant gastrointestinal disorders.  Review of outside blood work from November 2019 shows hemoglobin 11.5.  May 2019 with normal comprehensive metabolic panel and TSH.  Normal calcium.  REVIEW OF SYSTEMS:  All non-GI ROS negative unless otherwise stated in the HPI except for sinus and allergy, back pain  Past Medical History:  Diagnosis Date  . Allergy   . Anemia   . Irritable bowel syndrome     Past Surgical History:  Procedure Laterality Date  . COLONOSCOPY    . WISDOM TOOTH EXTRACTION      Social History ANDEE CHIVERS  reports that she has never smoked. She has never used smokeless tobacco. She reports current alcohol use. She reports that she does not use drugs.  family history includes Arthritis  in her maternal grandmother; Constipation in her father; Hyperlipidemia in her maternal grandmother and mother; Hypertension in her mother and paternal grandmother; Stroke in her maternal grandmother.  Allergies  Allergen Reactions  . Shellfish Allergy Anaphylaxis       PHYSICAL EXAMINATION: Vital signs: BP (!) 96/56   Pulse 68   Temp 98.5 F (36.9 C)   Ht 5\' 6"  (1.676 m)   Wt 156 lb 3.2 oz (70.9 kg)   BMI 25.21 kg/m   Constitutional: generally well-appearing, no acute distress Psychiatric: alert and oriented x3, cooperative Eyes: extraocular movements intact, anicteric, conjunctiva pink Mouth: oral pharynx moist, no lesions Neck: supple no lymphadenopathy Cardiovascular: heart regular rate and rhythm, no murmur Lungs: clear to auscultation bilaterally Abdomen: soft, nontender, nondistended, no obvious ascites, no peritoneal signs, normal bowel sounds, no organomegaly Rectal: Omitted Extremities: no clubbing, cyanosis, or lower extremity edema bilaterally Skin: no lesions on visible extremities Neuro: No focal deficits.  Cranial nerves intact  ASSESSMENT:  1.  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 2.  Normal colonoscopy and upper endoscopy February 2019 ([facility])   PLAN:  1.  Long discussion today on constipation predominant irritable bowel syndrome and approaches to treatment. 2.  Linzess 290 mcg daily.  Multiple samples given.  If helpful, then we will prescribe this for the patient.  She will keep [place] posted regarding her progress.  Otherwise, she will resume general medical care with Dr.  Blythe.

## 2019-10-24 ENCOUNTER — Telehealth: Payer: Self-pay | Admitting: Internal Medicine

## 2019-10-25 NOTE — Telephone Encounter (Signed)
Spoke with pt and she is aware. States miralax causes her to have abdominal cramping but she will try this and let us know how she does.

## 2019-10-25 NOTE — Telephone Encounter (Signed)
Pt states she has been taking the Linzess 265mcg that Dr. Henrene Pastor prescribed since last Monday but she has only had 1 bowel movement during this time. She wants to know what she should do now. Please advise.

## 2019-10-25 NOTE — Telephone Encounter (Signed)
MiraLAX daily.  I know she has been on this before, but not an accelerated dosage regimen.  She should start with 2 doses daily and increase by 1 dose daily every 5 days until the desired result is obtained.  Thereafter she should continue on that dosage of MiraLAX daily.  This can be adjusted up or down depending upon the effect

## 2019-11-03 DIAGNOSIS — Z01419 Encounter for gynecological examination (general) (routine) without abnormal findings: Secondary | ICD-10-CM | POA: Diagnosis not present

## 2019-11-03 DIAGNOSIS — Z3041 Encounter for surveillance of contraceptive pills: Secondary | ICD-10-CM | POA: Diagnosis not present

## 2019-11-03 DIAGNOSIS — N840 Polyp of corpus uteri: Secondary | ICD-10-CM | POA: Diagnosis not present

## 2019-11-07 ENCOUNTER — Encounter: Payer: Self-pay | Admitting: Family Medicine

## 2019-11-08 NOTE — Telephone Encounter (Signed)
Called patient about virtual visit patient stated she would call when she wanted to schedule

## 2020-01-05 ENCOUNTER — Encounter (HOSPITAL_BASED_OUTPATIENT_CLINIC_OR_DEPARTMENT_OTHER): Payer: Self-pay

## 2020-01-05 ENCOUNTER — Ambulatory Visit: Payer: Self-pay | Admitting: *Deleted

## 2020-01-05 ENCOUNTER — Emergency Department (HOSPITAL_BASED_OUTPATIENT_CLINIC_OR_DEPARTMENT_OTHER)
Admission: EM | Admit: 2020-01-05 | Discharge: 2020-01-05 | Disposition: A | Discharge status: Home / Self Care | Payer: BC Managed Care – PPO | Attending: Emergency Medicine | Admitting: Emergency Medicine

## 2020-01-05 ENCOUNTER — Other Ambulatory Visit: Payer: Self-pay

## 2020-01-05 ENCOUNTER — Emergency Department (HOSPITAL_BASED_OUTPATIENT_CLINIC_OR_DEPARTMENT_OTHER): Payer: BC Managed Care – PPO

## 2020-01-05 DIAGNOSIS — K589 Irritable bowel syndrome without diarrhea: Secondary | ICD-10-CM | POA: Insufficient documentation

## 2020-01-05 DIAGNOSIS — N83202 Unspecified ovarian cyst, left side: Secondary | ICD-10-CM | POA: Insufficient documentation

## 2020-01-05 DIAGNOSIS — N390 Urinary tract infection, site not specified: Secondary | ICD-10-CM | POA: Diagnosis not present

## 2020-01-05 DIAGNOSIS — R1031 Right lower quadrant pain: Secondary | ICD-10-CM | POA: Diagnosis not present

## 2020-01-05 DIAGNOSIS — N83201 Unspecified ovarian cyst, right side: Secondary | ICD-10-CM | POA: Insufficient documentation

## 2020-01-05 DIAGNOSIS — R110 Nausea: Secondary | ICD-10-CM | POA: Diagnosis not present

## 2020-01-05 LAB — COMPREHENSIVE METABOLIC PANEL
ALT: 11 U/L (ref 0–44)
AST: 14 U/L — ABNORMAL LOW (ref 15–41)
Albumin: 4.3 g/dL (ref 3.5–5.0)
Alkaline Phosphatase: 36 U/L — ABNORMAL LOW (ref 38–126)
Anion gap: 10 (ref 5–15)
BUN: 8 mg/dL (ref 6–20)
CO2: 23 mmol/L (ref 22–32)
Calcium: 9.4 mg/dL (ref 8.9–10.3)
Chloride: 103 mmol/L (ref 98–111)
Creatinine, Ser: 0.68 mg/dL (ref 0.44–1.00)
GFR calc Af Amer: >60 mL/min (ref >60)
GFR calc non Af Amer: >60 mL/min (ref >60)
Glucose, Bld: 100 mg/dL — ABNORMAL HIGH (ref 70–99)
Potassium: 3.8 mmol/L (ref 3.5–5.1)
Sodium: 136 mmol/L (ref 135–145)
Total Bilirubin: 0.3 mg/dL (ref 0.3–1.2)
Total Protein: 7.9 g/dL (ref 6.5–8.1)

## 2020-01-05 LAB — LIPASE, BLOOD: Lipase: 25 U/L (ref 11–51)

## 2020-01-05 LAB — URINALYSIS, ROUTINE W REFLEX MICROSCOPIC
Bilirubin Urine: NEGATIVE
Glucose, UA: NEGATIVE mg/dL
Ketones, ur: NEGATIVE mg/dL
Nitrite: NEGATIVE
Protein, ur: NEGATIVE mg/dL
Specific Gravity, Urine: >1.03 — ABNORMAL HIGH (ref 1.005–1.030)
pH: 6 (ref 5.0–8.0)

## 2020-01-05 LAB — CBC WITH DIFFERENTIAL/PLATELET
Abs Immature Granulocytes: 0.02 10*3/uL (ref 0.00–0.07)
Basophils Absolute: 0.1 10*3/uL (ref 0.0–0.1)
Basophils Relative: 1 %
Eosinophils Absolute: 0.1 10*3/uL (ref 0.0–0.5)
Eosinophils Relative: 2 %
HCT: 40.1 % (ref 36.0–46.0)
Hemoglobin: 13.3 g/dL (ref 12.0–15.0)
Immature Granulocytes: 0 %
Lymphocytes Relative: 34 %
Lymphs Abs: 2.3 10*3/uL (ref 0.7–4.0)
MCH: 31 pg (ref 26.0–34.0)
MCHC: 33.2 g/dL (ref 30.0–36.0)
MCV: 93.5 fL (ref 80.0–100.0)
Monocytes Absolute: 0.5 10*3/uL (ref 0.1–1.0)
Monocytes Relative: 7 %
Neutro Abs: 3.8 10*3/uL (ref 1.7–7.7)
Neutrophils Relative %: 56 %
Platelets: 316 10*3/uL (ref 150–400)
RBC: 4.29 MIL/uL (ref 3.87–5.11)
RDW: 12.2 % (ref 11.5–15.5)
WBC: 6.8 10*3/uL (ref 4.0–10.5)
nRBC: 0 % (ref 0.0–0.2)

## 2020-01-05 LAB — URINALYSIS, MICROSCOPIC (REFLEX)

## 2020-01-05 LAB — PREGNANCY, URINE: Preg Test, Ur: NEGATIVE

## 2020-01-05 MED ORDER — NAPROXEN 500 MG PO TABS: 500.0000 mg | ORAL_TABLET | Freq: Two times a day (BID) | ORAL | 0 refills | Status: DC

## 2020-01-05 MED ORDER — CEPHALEXIN 500 MG PO CAPS: 500.0000 mg | ORAL_CAPSULE | Freq: Two times a day (BID) | ORAL | 0 refills | Status: AC

## 2020-01-05 MED ORDER — IOHEXOL 300 MG/ML  SOLN
100.0000 mL | Freq: Once | INTRAMUSCULAR | Status: AC | PRN
  Administered 2020-01-05: 100 mL via INTRAVENOUS

## 2020-01-05 MED ORDER — ONDANSETRON HCL 4 MG PO TABS: 4.0000 mg | ORAL_TABLET | Freq: Three times a day (TID) | ORAL | 0 refills | Status: DC | PRN

## 2020-01-05 MED ORDER — MORPHINE SULFATE (PF) 4 MG/ML IV SOLN
4.0000 mg | Freq: Once | INTRAVENOUS | Status: AC
  Administered 2020-01-05: 13:00:00 4 mg via INTRAVENOUS
  Filled 2020-01-05: qty 1

## 2020-01-05 MED ORDER — SODIUM CHLORIDE 0.9 % IV BOLUS (SEPSIS)
1000.0000 mL | Freq: Once | INTRAVENOUS | Status: AC
  Administered 2020-01-05: 1000 mL via INTRAVENOUS

## 2020-01-05 NOTE — Discharge Instructions (Addendum)
You're seen today for abdominal pain. Your blood work is normal. Your urine showed signs of a urinary tract infection. Your CT scan showed ovarian cyst on both sides which is likely causing your pain. We are going to treat you with anti-inflammatory pain medications, nausea medication and antibiotics. If your pain does not improve or if you have worsening symptoms please return to the emergency department for reevaluation of your symptoms. Otherwise if you are improving and follow-up with OB/GYN. Thank you for allowing me to care for you today. Please return to the emergency department if you have new or worsening symptoms. Take your medications as instructed.

## 2020-01-05 NOTE — ED Triage Notes (Signed)
Pt reports long hx of abd pain with constipation-today she woke with pain to RLQ and nausea-NAD-steady gait-was advised by PCP to come to ED

## 2020-01-05 NOTE — ED Provider Notes (Signed)
[facility] EMERGENCY DEPARTMENT Provider Note   CSN: [id number] Arrival date & time: 01/05/20  1233     History Chief Complaint  Patient presents with  . Abdominal Pain    Kylie Lane is a 29 y.o. female.  Patient is a 29 year old female with past medical history of irritable bowel syndrome, constipation presenting to the emergency department for acute right lower quadrant pain.  Patient reports that this morning she began to have pain around the center of her bellybutton which then migrated to the left lower quadrant.  She reports that this feels different than her usual abdominal pain.  She reports nausea without vomiting.  Denies any diarrhea, fever, chills.  She reports that she tried to eat some soup this morning around 9 AM and was able to only keep a little bit down before stopping due to nausea.        Past Medical History:  Diagnosis Date  . Allergy   . Anemia   . Irritable bowel syndrome     Patient Active Problem List   Diagnosis Date Noted  . Abdominal pain 11/22/2018  . Preventative health care 04/03/2018  . Constipation 03/30/2018  . Environmental allergies 03/30/2018  . Anemia 03/30/2018    Past Surgical History:  Procedure Laterality Date  . COLONOSCOPY    . WISDOM TOOTH EXTRACTION       OB History   No obstetric history on file.     Family History  Problem Relation Age of Onset  . Hyperlipidemia Mother   . Hypertension Mother   . Arthritis Maternal Grandmother   . Hyperlipidemia Maternal Grandmother   . Stroke Maternal Grandmother   . Hypertension Paternal Grandmother   . Constipation Father   . Colon cancer Neg Hx   . Esophageal cancer Neg Hx   . Pancreatic cancer Neg Hx   . Stomach cancer Neg Hx   . Liver disease Neg Hx     Social History   Tobacco Use  . Smoking status: Never Smoker  . Smokeless tobacco: Never Used  Substance Use Topics  . Alcohol use: Yes    Comment: rare  . Drug use: Never    Home  Medications Prior to Admission medications   Medication Sig Start Date End Date Taking? Authorizing Provider  cephALEXin (KEFLEX) 500 MG capsule Take 1 capsule (500 mg total) by mouth 2 (two) times daily for 7 days. 01/05/20 01/12/20  Ronnie Doss A, PA-C  fluticasone (FLONASE) 50 MCG/ACT nasal spray Place 2 sprays into both nostrils daily. 04/11/19   Saguier, Ramon Dredge, PA-C  hyoscyamine (LEVSIN SL) 0.125 MG SL tablet DISSOLVE 1 TABLET UNDER THE TONGUE TWICE DAILY AS NEEDED 09/12/19   Bradd Canary, MD  linaclotide [facility]) 290 MCG CAPS capsule Take 1 capsule (290 mcg total) by mouth daily before breakfast. 10/17/19   Hilarie Fredrickson, MD  loratadine (CLARITIN) 10 MG tablet Take 1 tablet (10 mg total) by mouth daily. 05/02/19   Saguier, Ramon Dredge, PA-C  Multiple Vitamin (MULTIVITAMIN) tablet Take 1 tablet by mouth daily.    [provider]  naproxen (NAPROSYN) 500 MG tablet Take 1 tablet (500 mg total) by mouth 2 (two) times daily. 01/05/20   Ronnie Doss A, PA-C  ondansetron (ZOFRAN) 4 MG tablet Take 1 tablet (4 mg total) by mouth every 8 (eight) hours as needed for nausea or vomiting. 01/05/20   Arlyn Dunning, PA-C  Polyethylene Glycol 3350 (MIRALAX PO) Take by mouth daily.    [provider]  Probiotic Product (PROBIOTIC ADVANCED PO) Take by mouth.    [provider]  UNABLE TO FIND Med Name: Ferrofood bid    [provider]    Allergies    Shellfish allergy  Review of Systems   Review of Systems  Constitutional: Positive for appetite change. Negative for chills, fatigue and fever.  HENT: Negative for congestion and sore throat.   Respiratory: Negative for cough and shortness of breath.   Gastrointestinal: Positive for abdominal pain and nausea. Negative for constipation, diarrhea and vomiting.  Genitourinary: Negative for dysuria and frequency.  Musculoskeletal: Negative for back pain and myalgias.  Neurological: Negative for dizziness, light-headedness and  headaches.    Physical Exam Updated Vital Signs BP 125/76 (BP Location: Right Arm)   Pulse 68   Temp 98.4 F (36.9 C) (Oral)   Resp 18   Ht 5\' 5"  (1.651 m)   Wt 68 kg   LMP 12/22/2019   SpO2 100%   BMI 24.96 kg/m   Physical Exam Vitals and nursing note reviewed.  Constitutional:      General: She is not in acute distress.    Appearance: Normal appearance. She is well-developed. She is not ill-appearing, toxic-appearing or diaphoretic.  HENT:     Head: Normocephalic.     Mouth/Throat:     Mouth: Mucous membranes are moist.  Eyes:     Conjunctiva/sclera: Conjunctivae normal.  Cardiovascular:     Rate and Rhythm: Normal rate and regular rhythm.     Heart sounds: No murmur.  Pulmonary:     Effort: Pulmonary effort is normal.  Abdominal:     General: Abdomen is flat. Bowel sounds are increased.     Palpations: Abdomen is soft.     Tenderness: There is abdominal tenderness in the right lower quadrant and periumbilical area. There is guarding. There is no right CVA tenderness, left CVA tenderness or rebound. Negative signs include Murphy's sign and Rovsing's sign.  Genitourinary:    Adnexa: Right adnexa normal and left adnexa normal.  Skin:    General: Skin is warm and dry.  Neurological:     Mental Status: She is alert.  Psychiatric:        Mood and Affect: Mood normal.     ED Results / Procedures / Treatments   Labs (all labs ordered are listed, but only abnormal results are displayed) Labs Reviewed  URINALYSIS, ROUTINE W REFLEX MICROSCOPIC - Abnormal; Notable for the following components:      Result Value   APPearance CLOUDY (*)    Specific Gravity, Urine >1.030 (*)    Hgb urine dipstick TRACE (*)    Leukocytes,Ua MODERATE (*)    All other components within normal limits  URINALYSIS, MICROSCOPIC (REFLEX) - Abnormal; Notable for the following components:   Bacteria, UA MANY (*)    All other components within normal limits  COMPREHENSIVE METABOLIC PANEL -  Abnormal; Notable for the following components:   Glucose, Bld 100 (*)    AST 14 (*)    Alkaline Phosphatase 36 (*)    All other components within normal limits  PREGNANCY, URINE  CBC WITH DIFFERENTIAL/PLATELET  LIPASE, BLOOD    EKG None  Radiology CT ABDOMEN PELVIS W CONTRAST  Result Date: 01/05/2020 CLINICAL DATA:  Right lower quadrant pain, nausea EXAM: CT ABDOMEN AND PELVIS WITH CONTRAST TECHNIQUE: Multidetector CT imaging of the abdomen and pelvis was performed using the standard protocol following bolus administration of intravenous contrast. CONTRAST:  185mL OMNIPAQUE  IOHEXOL 300 MG/ML  SOLN COMPARISON:  None. FINDINGS: Lower chest: No acute abnormality. Hepatobiliary: No solid liver abnormality is seen. No gallstones, gallbladder wall thickening, or biliary dilatation. Pancreas: Unremarkable. No pancreatic ductal dilatation or surrounding inflammatory changes. Spleen: Normal in size without significant abnormality. Adrenals/Urinary Tract: Adrenal glands are unremarkable. Kidneys are normal, without renal calculi, solid lesion, or hydronephrosis. Bladder is unremarkable. Stomach/Bowel: Stomach is within normal limits. Appendix is not clearly visualized although likely adjacent to the cecal base and containing small foci of air (series 5, image 24). No secondary inflammatory findings in this vicinity. No evidence of bowel wall thickening, distention, or inflammatory changes. Vascular/Lymphatic: No significant vascular findings are present. No enlarged abdominal or pelvic lymph nodes. Reproductive: Suspect bilateral ovarian cysts, which are difficult to appreciate against adjacent fluid-filled loops of small bowel. Other: No abdominal wall hernia or abnormality. No abdominopelvic ascites. Musculoskeletal: No acute or significant osseous findings. IMPRESSION: 1. Appendix is not clearly visualized although likely adjacent to the cecal base and containing small foci of air. No secondary  inflammatory findings in this vicinity. 2. Suspect bilateral ovarian cysts, which are difficult to appreciate against adjacent fluid-filled loops of small bowel. Consider pelvic ultrasound for further evaluation if symptoms are felt to be referable. Electronically Signed   By: Lauralyn Primes M.D.   On: 01/05/2020 14:00    Procedures Procedures (including critical care time)  Medications Ordered in ED Medications  sodium chloride 0.9 % bolus 1,000 mL (1,000 mLs Intravenous New Bag/Given 01/05/20 1348)  morphine 4 MG/ML injection 4 mg (4 mg Intravenous Given 01/05/20 1321)  iohexol (OMNIPAQUE) 300 MG/ML solution 100 mL (100 mLs Intravenous Contrast Given 01/05/20 1330)    ED Course  I have reviewed the triage vital signs and the nursing notes.  Pertinent labs & imaging results that were available during my care of the patient were reviewed by me and considered in my medical decision making (see chart for details).  Clinical Course as of Jan 04 1421  Thu Jan 05, 2020  1417 Patient presenting with acute abdominal pain starting this morning. Labs are reassuring, CT scan shows no evidence for appendicitis but does show that she has bilateral ovarian cyst. She also has evidence of UTI. She is not having any vaginal bleeding or vaginal complaints or dysuria. She is afebrile and reports that her pain is feeling much improved after morphine. Discussed with patient that we are going to treat her for pain with her ovarian cyst as well as UTI. She was advised on very strict return precautions for reevaluation if her symptoms do not improve or if they worsen. She reports that she has an OB/GYN doctor and can follow-up with her in a couple of days. Discussed with Dr. Judd Lien and plan agreed upon   [KM]    Clinical Course User Index [KM] Jeral Pinch   MDM Rules/Calculators/A&P                      Based on review of vitals, medical screening exam, lab work and/or imaging, there does not appear to be  an acute, emergent etiology for the patient's symptoms. Counseled pt on good return precautions and encouraged both PCP and ED follow-up as needed.  Prior to discharge, I also discussed incidental imaging findings with patient in detail and advised appropriate, recommended follow-up in detail.  Clinical Impression: 1. Right lower quadrant abdominal pain   2. Cysts of both ovaries   3. Acute UTI  Disposition: Discharge  Prior to providing a prescription for a controlled substance, I independently reviewed the patient's recent prescription history on the [place] Controlled Substance Reporting System. The patient had no recent or regular prescriptions and was deemed appropriate for a brief, less than 3 day prescription of narcotic for acute analgesia.  This note was prepared with assistance of Conservation officer, historic buildings. Occasional wrong-word or sound-a-like substitutions may have occurred due to the inherent limitations of voice recognition software.  Final Clinical Impression(s) / ED Diagnoses Final diagnoses:  Right lower quadrant abdominal pain  Cysts of both ovaries  Acute UTI    Rx / DC Orders ED Discharge Orders         Ordered    ondansetron (ZOFRAN) 4 MG tablet  Every 8 hours PRN     01/05/20 1422    cephALEXin (KEFLEX) 500 MG capsule  2 times daily     01/05/20 1422    naproxen (NAPROSYN) 500 MG tablet  2 times daily     01/05/20 1422           Jeral Pinch 01/05/20 1423    Geoffery Lyons, MD 01/06/20 0710

## 2020-01-05 NOTE — Telephone Encounter (Signed)
Patient calling with severe abdominal pain- advised ED  Reason for Disposition . [1] SEVERE pain (e.g., excruciating) AND [2] present > 1 hour  Answer Assessment - Initial Assessment Questions 1. LOCATION: "Where does it hurt?"      At bellybutton and lower- midline- or right 2. RADIATION: "Does the pain shoot anywhere else?" (e.g., chest, back)     no 3. ONSET: "When did the pain begin?" (e.g., minutes, hours or days ago)      Start this morning- comes and goes 4. SUDDEN: "Gradual or sudden onset?"     sudden 5. PATTERN "Does the pain come and go, or is it constant?"    - If constant: "Is it getting better, staying the same, or worsening?"      (Note: Constant means the pain never goes away completely; most serious pain is constant and it progresses)     - If intermittent: "How long does it last?" "Do you have pain now?"     (Note: Intermittent means the pain goes away completely between bouts)     Comes and goes- lasts 1-2 days and goes away, today different type of pain 6. SEVERITY: "How bad is the pain?"  (e.g., Scale 1-10; mild, moderate, or severe)   - MILD (1-3): doesn't interfere with normal activities, abdomen soft and not tender to touch    - MODERATE (4-7): interferes with normal activities or awakens from sleep, tender to touch    - SEVERE (8-10): excruciating pain, doubled over, unable to do any normal activities      severe 7. RECURRENT SYMPTOM: "Have you ever had this type of abdominal pain before?" If so, ask: "When was the last time?" and "What happened that time?"      different from felt in past- patient does suffer from constipation 8. CAUSE: "What do you think is causing the abdominal pain?"     No idea 9. RELIEVING/AGGRAVATING FACTORS: "What makes it better or worse?" (e.g., movement, antacids, bowel movement)     Nothing helping 10. OTHER SYMPTOMS: "Has there been any vomiting, diarrhea, constipation, or urine problems?"       Nausea, BM- yesterday normal 11.  PREGNANCY: "Is there any chance you are pregnant?" "When was your last menstrual period?"       No- LMP-2 weeks ago  Protocols used: ABDOMINAL PAIN - [facility]

## 2020-01-06 DIAGNOSIS — R110 Nausea: Secondary | ICD-10-CM | POA: Diagnosis not present

## 2020-01-06 DIAGNOSIS — R5383 Other fatigue: Secondary | ICD-10-CM | POA: Diagnosis not present

## 2020-01-06 DIAGNOSIS — Z3041 Encounter for surveillance of contraceptive pills: Secondary | ICD-10-CM | POA: Diagnosis not present

## 2020-01-06 DIAGNOSIS — R1031 Right lower quadrant pain: Secondary | ICD-10-CM | POA: Diagnosis not present

## 2020-01-07 LAB — URINE CULTURE

## 2020-01-12 ENCOUNTER — Ambulatory Visit: Payer: BC Managed Care – PPO | Admitting: Internal Medicine

## 2020-01-28 DIAGNOSIS — Z20828 Contact with and (suspected) exposure to other viral communicable diseases: Secondary | ICD-10-CM | POA: Diagnosis not present

## 2020-02-02 DIAGNOSIS — R1031 Right lower quadrant pain: Secondary | ICD-10-CM | POA: Diagnosis not present

## 2020-02-02 DIAGNOSIS — Z8742 Personal history of other diseases of the female genital tract: Secondary | ICD-10-CM | POA: Diagnosis not present

## 2020-02-02 DIAGNOSIS — R110 Nausea: Secondary | ICD-10-CM | POA: Diagnosis not present

## 2020-02-02 DIAGNOSIS — R5383 Other fatigue: Secondary | ICD-10-CM | POA: Diagnosis not present

## 2020-03-09 ENCOUNTER — Ambulatory Visit: Payer: BC Managed Care – PPO | Attending: Internal Medicine

## 2020-03-09 DIAGNOSIS — Z23 Encounter for immunization: Secondary | ICD-10-CM

## 2020-03-09 NOTE — Progress Notes (Signed)
   Covid-19 Vaccination Clinic  Name:  [name]    MRN: [id number] DOB: 1991-03-22  03/09/2020  [name] was observed post Covid-19 immunization for 15 minutes without incident. She was provided with Vaccine Information Sheet and instruction to access the V-Safe system.   [name] was instructed to call 911 with any severe reactions post vaccine: Marland Kitchen Difficulty breathing  . Swelling of face and throat  . A fast heartbeat  . A bad rash all over body  . Dizziness and weakness   Immunizations Administered    Name Date Dose VIS Date Route   Pfizer COVID-19 Vaccine 03/09/2020  4:13 PM 0.3 mL 12/02/2019 Intramuscular   Manufacturer: [employer], [employer]   Lot: [id number]   NDC: [address]-9

## 2020-04-04 ENCOUNTER — Ambulatory Visit: Payer: BC Managed Care – PPO | Attending: Internal Medicine

## 2020-04-04 DIAGNOSIS — Z23 Encounter for immunization: Secondary | ICD-10-CM

## 2020-04-04 NOTE — Progress Notes (Signed)
   Covid-19 Vaccination Clinic  Name:  JISELL MAJER    MRN: [id number] DOB: 01/20/1991  04/04/2020  Ms. Polson was observed post Covid-19 immunization for 15 minutes without incident. She was provided with Vaccine Information Sheet and instruction to access the V-Safe system.   Ms. Paradise was instructed to call 911 with any severe reactions post vaccine: Marland Kitchen Difficulty breathing  . Swelling of face and throat  . A fast heartbeat  . A bad rash all over body  . Dizziness and weakness   Immunizations Administered    Name Date Dose VIS Date Route   Pfizer COVID-19 Vaccine 04/04/2020  3:54 PM 0.3 mL 12/02/2019 Intramuscular   Manufacturer: [employer], [employer]   Lot: [id number]   NDC: [address]-1

## 2020-08-16 DIAGNOSIS — R1084 Generalized abdominal pain: Secondary | ICD-10-CM | POA: Diagnosis not present

## 2020-08-16 DIAGNOSIS — Z131 Encounter for screening for diabetes mellitus: Secondary | ICD-10-CM | POA: Diagnosis not present

## 2020-08-16 DIAGNOSIS — Z20822 Contact with and (suspected) exposure to covid-19: Secondary | ICD-10-CM | POA: Diagnosis not present

## 2020-08-16 DIAGNOSIS — Z1159 Encounter for screening for other viral diseases: Secondary | ICD-10-CM | POA: Diagnosis not present

## 2020-08-16 DIAGNOSIS — Z79899 Other long term (current) drug therapy: Secondary | ICD-10-CM | POA: Diagnosis not present

## 2020-08-16 DIAGNOSIS — Z13220 Encounter for screening for lipoid disorders: Secondary | ICD-10-CM | POA: Diagnosis not present

## 2020-08-16 DIAGNOSIS — D539 Nutritional anemia, unspecified: Secondary | ICD-10-CM | POA: Diagnosis not present

## 2020-08-16 DIAGNOSIS — R0602 Shortness of breath: Secondary | ICD-10-CM | POA: Diagnosis not present

## 2020-08-16 DIAGNOSIS — K5909 Other constipation: Secondary | ICD-10-CM | POA: Diagnosis not present

## 2020-08-20 DIAGNOSIS — K5909 Other constipation: Secondary | ICD-10-CM | POA: Diagnosis not present

## 2020-08-29 DIAGNOSIS — K5909 Other constipation: Secondary | ICD-10-CM | POA: Diagnosis not present

## 2020-08-30 DIAGNOSIS — K5909 Other constipation: Secondary | ICD-10-CM | POA: Diagnosis not present

## 2020-08-30 DIAGNOSIS — R1084 Generalized abdominal pain: Secondary | ICD-10-CM | POA: Diagnosis not present

## 2020-08-30 DIAGNOSIS — Z3041 Encounter for surveillance of contraceptive pills: Secondary | ICD-10-CM | POA: Diagnosis not present

## 2020-08-30 DIAGNOSIS — Z79899 Other long term (current) drug therapy: Secondary | ICD-10-CM | POA: Diagnosis not present

## 2020-10-02 DIAGNOSIS — N921 Excessive and frequent menstruation with irregular cycle: Secondary | ICD-10-CM | POA: Diagnosis not present

## 2020-10-02 DIAGNOSIS — Z3041 Encounter for surveillance of contraceptive pills: Secondary | ICD-10-CM | POA: Diagnosis not present

## 2020-11-02 IMAGING — CT CT ABD-PELV W/ CM
2 of 4 series · 16 of 46 positions shown, 18 images · IV contrast (omnipaque)
Comparison: None.

CLINICAL DATA: Right lower quadrant pain, nausea

EXAM:
CT ABDOMEN AND PELVIS WITH CONTRAST
TECHNIQUE: Multidetector CT imaging of the abdomen and pelvis was performed
using the standard protocol following bolus administration of
intravenous contrast.
CONTRAST:  100mL OMNIPAQUE IOHEXOL 300 MG/ML  SOLN

[Series 2: axial st · axial · 0.91mm/px · z∈[-427,-22]mm · 13 of 89 slices shown, 15 images]
[im 4/89  soft-tissue]
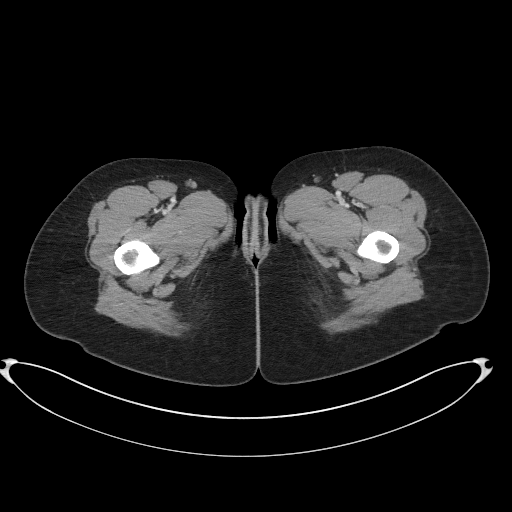
[im 4/89  bone]
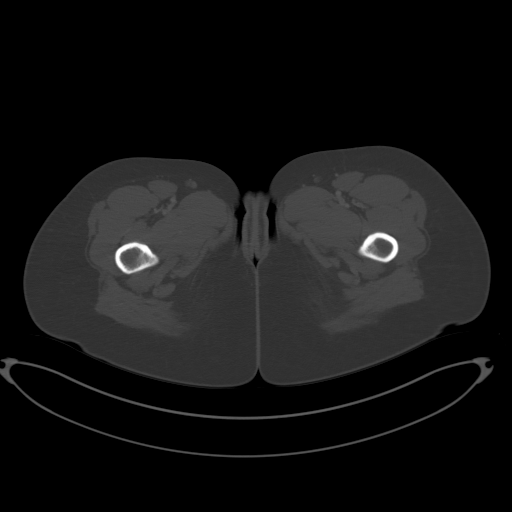
[im 11/89  soft-tissue]
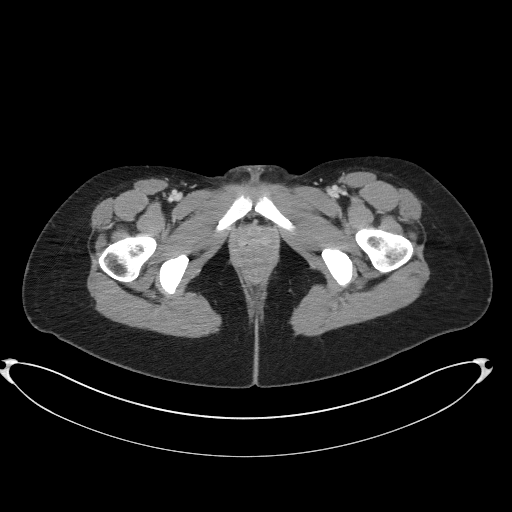
[im 17/89  soft-tissue]
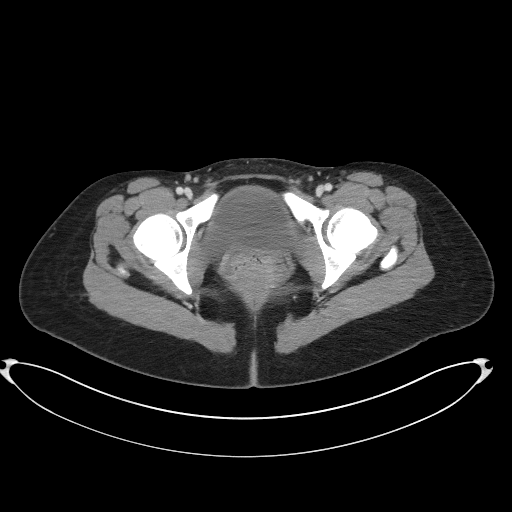
[im 24/89  soft-tissue]
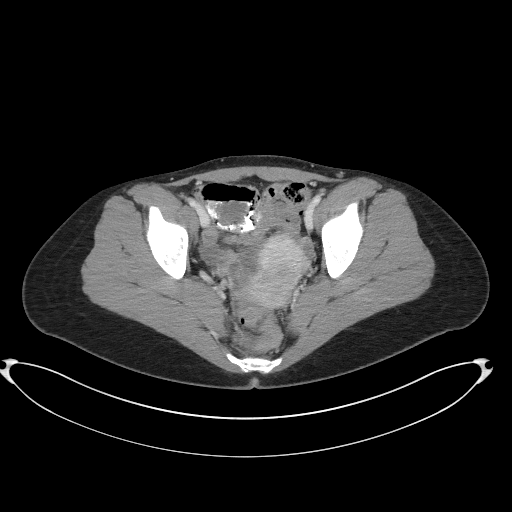
[im 31/89  soft-tissue]
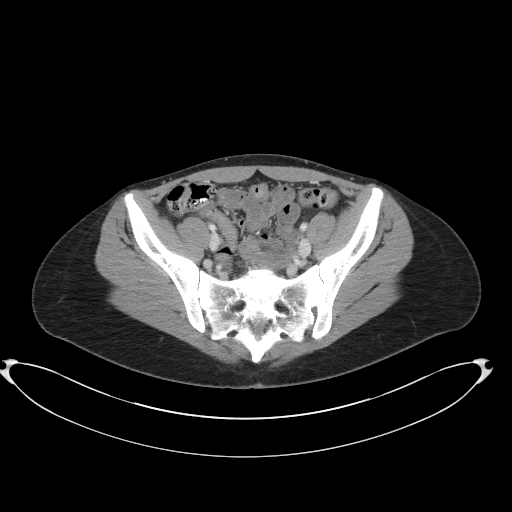
[im 38/89  soft-tissue]
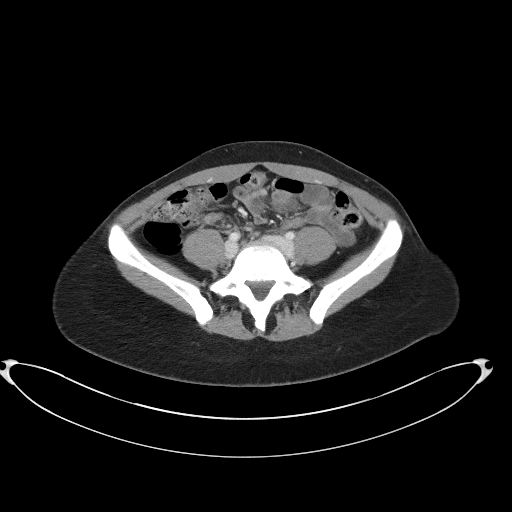
[im 45/89  soft-tissue]
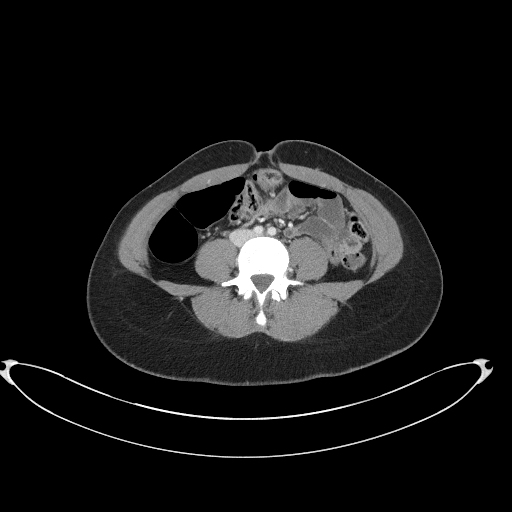
[im 51/89  soft-tissue]
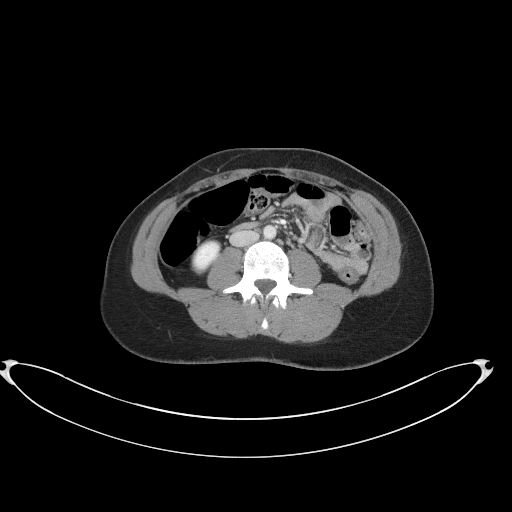
[im 58/89  soft-tissue]
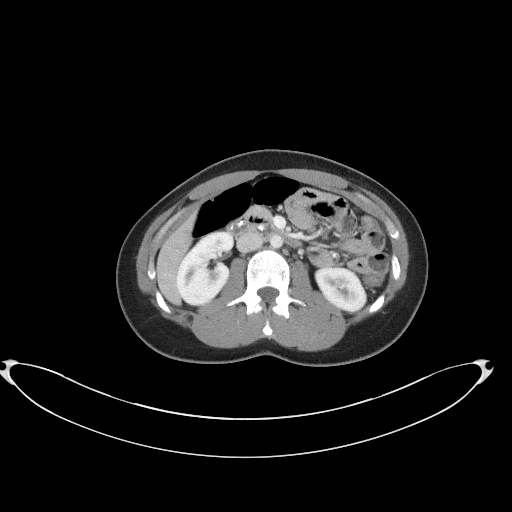
[im 58/89  bone]
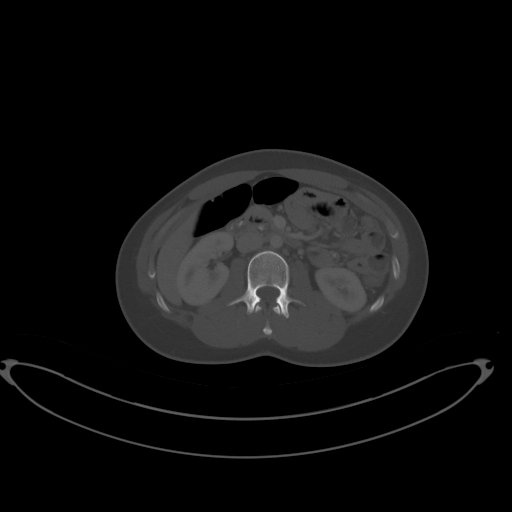
[im 65/89  soft-tissue]
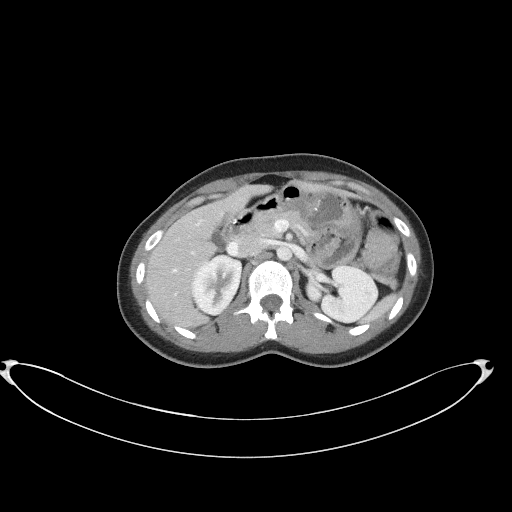
[im 72/89  soft-tissue]
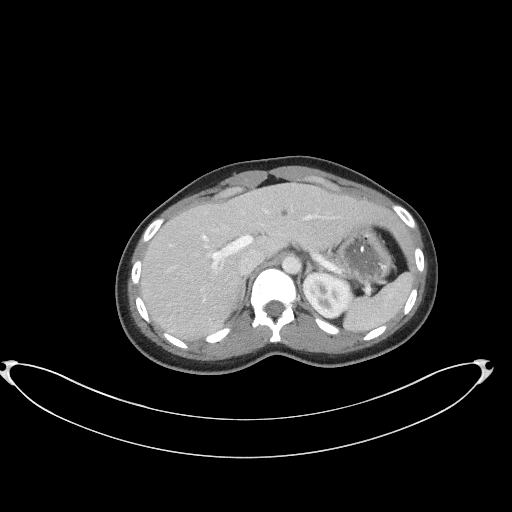
[im 78/89  soft-tissue]
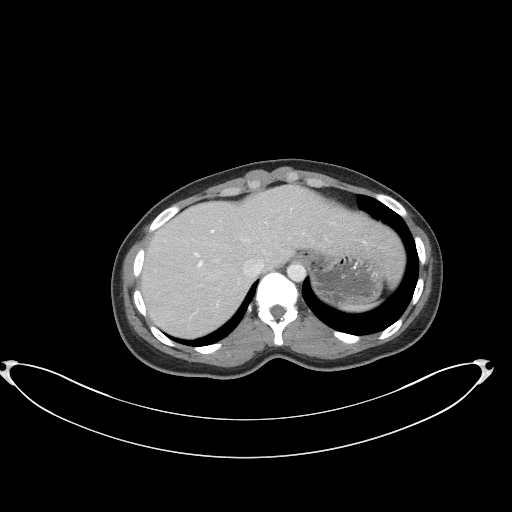
[im 85/89  soft-tissue]
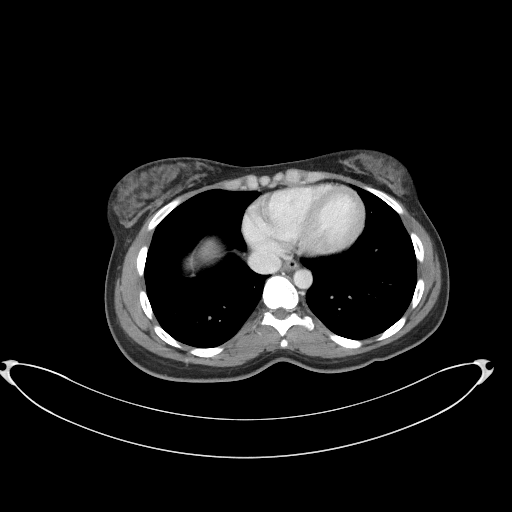

[Series 5: coronal st · coronal · 0.84mm/px · 3 of 75 slices shown]
[im 25/75  soft-tissue]
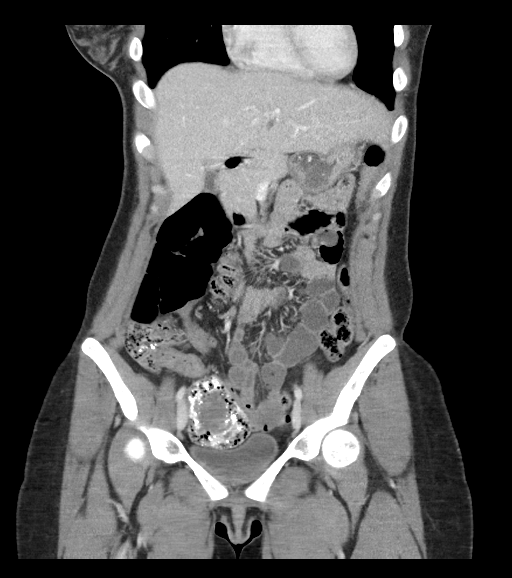
[im 33/75  soft-tissue]
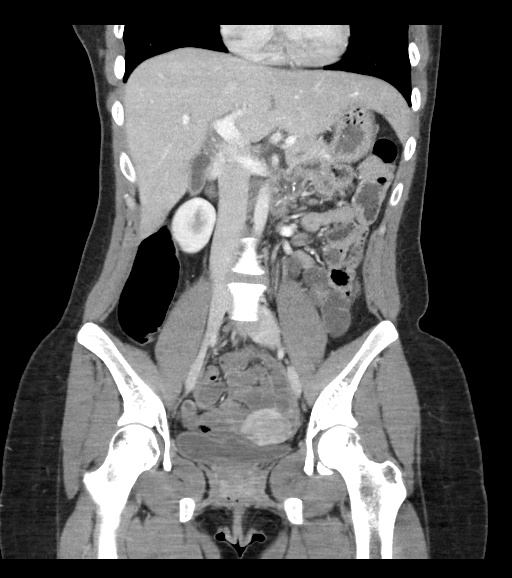
[im 42/75  soft-tissue]
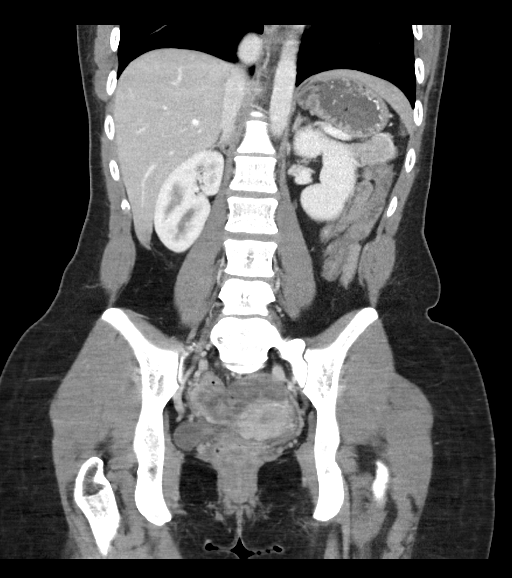

[16 of 46 positions shown; findings below may reference images not displayed]

FINDINGS: Lower chest: No acute abnormality.

Hepatobiliary: No solid liver abnormality is seen. No gallstones,
gallbladder wall thickening, or biliary dilatation.

Pancreas: Unremarkable. No pancreatic ductal dilatation or
surrounding inflammatory changes.

Spleen: Normal in size without significant abnormality.

Adrenals/Urinary Tract: Adrenal glands are unremarkable. Kidneys are
normal, without renal calculi, solid lesion, or hydronephrosis.
Bladder is unremarkable.

Stomach/Bowel: Stomach is within normal limits. Appendix is not
clearly visualized although likely adjacent to the cecal base and
containing small foci of air (series 5, image 24). No secondary
inflammatory findings in this vicinity. No evidence of bowel wall
thickening, distention, or inflammatory changes.

Vascular/Lymphatic: No significant vascular findings are present. No
enlarged abdominal or pelvic lymph nodes.

Reproductive: Suspect bilateral ovarian cysts, which are difficult
to appreciate against adjacent fluid-filled loops of small bowel.

Other: No abdominal wall hernia or abnormality. No abdominopelvic
ascites.

Musculoskeletal: No acute or significant osseous findings.
IMPRESSION: 1. Appendix is not clearly visualized although likely adjacent to
the cecal base and containing small foci of air. No secondary
inflammatory findings in this vicinity.
2. Suspect bilateral ovarian cysts, which are difficult to
appreciate against adjacent fluid-filled loops of small bowel.
Consider pelvic ultrasound for further evaluation if symptoms are
felt to be referable.

## 2022-04-21 HISTORY — PX: NASAL SINUS SURGERY: SHX719

## 2022-09-18 ENCOUNTER — Ambulatory Visit: Payer: 59 | Admitting: Family Medicine

## 2022-09-18 ENCOUNTER — Encounter: Payer: Self-pay | Admitting: Family Medicine

## 2022-09-18 VITALS — BP 130/70 | HR 100 | Temp 98.3°F | Resp 18 | Ht 65.0 in | Wt 167.4 lb

## 2022-09-18 DIAGNOSIS — J0100 Acute maxillary sinusitis, unspecified: Secondary | ICD-10-CM | POA: Diagnosis not present

## 2022-09-18 MED ORDER — CETIRIZINE HCL 10 MG PO TABS: 10.0000 mg | ORAL_TABLET | Freq: Every day | ORAL | 2 refills | Status: DC

## 2022-09-18 MED ORDER — FLUTICASONE PROPIONATE 50 MCG/ACT NA SUSP: 2.0000 | Freq: Every day | NASAL | 2 refills | Status: DC

## 2022-09-18 MED ORDER — PREDNISONE 20 MG PO TABS: 40.0000 mg | ORAL_TABLET | Freq: Every day | ORAL | 0 refills | Status: AC

## 2022-09-18 NOTE — Patient Instructions (Signed)
Continue to push fluids, practice good hand hygiene, and cover your mouth if you cough.  If you start having fevers, shaking or shortness of breath, seek immediate care.  OK to take Tylenol 1000 mg (2 extra strength tabs) or 975 mg (3 regular strength tabs) every 6 hours as needed.  Take the prednisone if things worsen to help open your sinuses.  Let us know if you need anything.

## 2022-09-18 NOTE — Progress Notes (Signed)
Chief Complaint  Patient presents with   URI    Colored mucus , sneezing     Kylie Lane here for URI complaints.  Duration: 2 day  Associated symptoms: sinus congestion, sinus pain, rhinorrhea, and slight cough Denies: itchy watery eyes, ear pain, ear drainage, sore throat, wheezing, shortness of breath, myalgia, and fevers, N/V/D Treatment to date: Tylenol cold and flu, allergy medication Sick contacts: Yes; kids Tested neg for covid.   Past Medical History:  Diagnosis Date   Allergy    Anemia    Irritable bowel syndrome     Objective BP 130/70   Pulse 100   Temp 98.3 F (36.8 C)   Resp 18   Ht 5\' 5"  (1.651 m)   Wt 167 lb 6.4 oz (75.9 kg)   SpO2 99%   BMI 27.86 kg/m  General: Awake, alert, appears stated age HEENT: AT, [place], ears patent b/l and TM's neg, nares patent w/o discharge, pharynx pink and without exudates, MMM Neck: No masses or asymmetry Heart: RRR Lungs: CTAB, no accessory muscle use Psych: Age appropriate judgment and insight, normal mood and affect  Acute maxillary sinusitis, recurrence not specified - Plan: predniSONE (DELTASONE) 20 MG tablet  Seems to be improving. 5 d pred burst 40 mg/d if she worsens, though doubt she will need. Continue to push fluids, practice good hand hygiene, cover mouth when coughing. F/u prn. If starting to experience fevers, shaking, or shortness of breath, seek immediate care. Pt voiced understanding and agreement to the plan.  [place], DO 09/18/22 2:29 PM

## 2023-08-11 NOTE — Progress Notes (Deleted)
NEW PATIENT Date of Service/Encounter:  08/11/23 Referring provider: Bradd Canary, MD Primary care provider: Bradd Canary, MD  Subjective:  Kylie Lane is a 32 y.o. female with a PMHx of *** presenting today for evaluation of chronic rhinitis and adverse reaction to food. History obtained from: chart review and {Persons; PED relatives w/patient:19415::"patient"}.   CONSULT ONLY  Chronic rhinitis: started *** Symptoms include: {Blank multiple:19196:a:"***","nasal congestion","rhinorrhea","post nasal drainage","sneezing","watery eyes","itchy eyes","itchy nose"}  Occurs {Blank single:19197::"year-round","seasonally-***","year-round with seasonal flares","***"} Potential triggers: *** Treatments tried: *** Previous allergy testing: {Blank single:19197::"yes","no"} History of reflux/heartburn: {Blank single:19197::"yes","no"} Previous sinus, ear, tonsil, adenoid surgeries: *** Previous sinus imaging: ***  Concern for Food Allergy:  Food of concern: shellfish History of reaction: *** Previous allergy testing {Blank single:19197::"yes","no"} Eats egg, dairy, wheat, soy, fish, shellfish, peanuts, tree nuts, sesame without reactions*** Carries an epinephrine autoinjector: {Blank single:19197::"yes","no"}  Chart Review:  Reviewed PCP notes from referral ***: ***  Other allergy screening: Asthma: {Blank single:19197::"yes","no"} Rhino conjunctivitis: {Blank single:19197::"yes","no"} Food allergy: {Blank single:19197::"yes","no"} Medication allergy: {Blank single:19197::"yes","no"} Hymenoptera allergy: {Blank single:19197::"yes","no"} Urticaria: {Blank single:19197::"yes","no"} Eczema:{Blank single:19197::"yes","no"} History of recurrent infections suggestive of immunodeficency: {Blank single:19197::"yes","no"} ***Vaccinations are up to date.   Past Medical History: Past Medical History:  Diagnosis Date   Allergy    Anemia    Irritable bowel syndrome    Medication  List:  Current Outpatient Medications  Medication Sig Dispense Refill   cetirizine (ZYRTEC ALLERGY) 10 MG tablet Take 1 tablet (10 mg total) by mouth daily. 90 tablet 2   fluticasone (FLONASE) 50 MCG/ACT nasal spray Place 2 sprays into both nostrils daily. 16 g 2   hyoscyamine (LEVSIN SL) 0.125 MG SL tablet DISSOLVE 1 TABLET UNDER THE TONGUE TWICE DAILY AS NEEDED 30 tablet 0   linaclotide (LINZESS) 290 MCG CAPS capsule Take 1 capsule (290 mcg total) by mouth daily before breakfast. 28 capsule 0   Multiple Vitamin (MULTIVITAMIN) tablet Take 1 tablet by mouth daily.     naproxen (NAPROSYN) 500 MG tablet Take 1 tablet (500 mg total) by mouth 2 (two) times daily. 30 tablet 0   Polyethylene Glycol 3350 (MIRALAX PO) Take by mouth daily.     Probiotic Product (PROBIOTIC ADVANCED PO) Take by mouth.     UNABLE TO FIND Med Name: Ferrofood bid     No current facility-administered medications for this visit.   Known Allergies:  Allergies  Allergen Reactions   Shellfish Allergy Anaphylaxis   Past Surgical History: Past Surgical History:  Procedure Laterality Date   COLONOSCOPY     WISDOM TOOTH EXTRACTION     Family History: Family History  Problem Relation Age of Onset   Hyperlipidemia Mother    Hypertension Mother    Arthritis Maternal Grandmother    Hyperlipidemia Maternal Grandmother    Stroke Maternal Grandmother    Hypertension Paternal Grandmother    Constipation Father    Colon cancer Neg Hx    Esophageal cancer Neg Hx    Pancreatic cancer Neg Hx    Stomach cancer Neg Hx    Liver disease Neg Hx    Social History: Lucinda lives ***.   ROS:  All other systems negative except as noted per HPI.  Objective:  There were no vitals taken for this visit. There is no height or weight on file to calculate BMI. Physical Exam:  General Appearance:  Alert, cooperative, no distress, appears stated age  Head:  Normocephalic, without obvious abnormality, atraumatic  Eyes:  Conjunctiva  clear, EOM's intact  Ears {Blank multiple:19196:a:"***","EACs normal bilaterally","normal TMs bilaterally","ear tubes present bilaterally without exudate"}  Nose: Nares normal, {Blank multiple:19196:a:"***","hypertrophic turbinates","normal mucosa","no visible anterior polyps","septum midline"}  Throat: Lips, tongue normal; teeth and gums normal, {Blank multiple:19196:a:"***","normal posterior oropharynx","tonsils 2+","tonsils 3+","no tonsillar exudate","+ cobblestoning","surgically absent tonsils","mildly erythematous posterior oropharynx"}  Neck: Supple, symmetrical  Lungs:   {Blank multiple:19196:a:"***","clear to auscultation bilaterally","end-expiratory wheezing","wheezing throughout"}, Respirations unlabored, {Blank multiple:19196:a:"***","no coughing","intermittent dry coughing","intermittent productive-sounding cough"}  Heart:  {Blank multiple:19196:a:"***","regular rate and rhythm","no murmur"}, Appears well perfused  Extremities: No edema  Skin: {Blank multiple:19196:a:"***","erythematous, dry patches scattered on ***","lichenification on ***","Skin color, texture, turgor normal","no rashes or lesions on visualized portions of skin"}  Neurologic: No gross deficits   Diagnostics: Spirometry:  Tracings reviewed. Her effort: {Blank single:19197::"Good reproducible efforts.","It was hard to get consistent efforts and there is a question as to whether this reflects a maximal maneuver.","Poor effort, data can not be interpreted.","Variable effort-results affected","effort okay for first attempt at spirometry.","Results not reproducible due to ***"} FVC: ***L (pre), ***L  (post) FEV1: ***L, ***% predicted (pre), ***L, ***% predicted (post) FEV1/FVC ratio: *** (pre), *** (post) Interpretation: {Blank single:19197::"Spirometry consistent with mild obstructive disease","Spirometry consistent with moderate obstructive disease","Spirometry consistent with severe obstructive disease","Spirometry  consistent with possible restrictive disease","Spirometry consistent with mixed obstructive and restrictive disease","Spirometry uninterpretable due to technique","Spirometry consistent with normal pattern","No overt abnormalities noted given today's efforts","Nonobstructive ratio, low FEV1","Nonobstructive ratio, low FEV1, possible restriction"}.  Please see scanned spirometry results for details.  Skin Testing: {Blank single:19197::"Select foods","Environmental allergy panel","Environmental allergy panel and select foods","Food allergy panel","None","Deferred due to recent antihistamines use","deferred due to recent reaction","Pediatric Environmental Allergy Panel","Pediatric Food Panel","Select foods and environmental allergies"}. {Blank single:19197::"Adequate positive and negative controls","Inadequate positive control-testing invalid","Adequate positive and negative controls, dermatographism present, testing difficult to interpret"}. Results discussed with patient/family.   {Blank single:19197::"Allergy testing results were read and interpreted by myself, documented by clinical staff.","Allergy testing results were read by ***,FNP, documented by clinical staff"}  Labs:  Lab Orders  No laboratory test(s) ordered today     Assessment and Plan  ***  {Blank single:19197::"This note in its entirety was forwarded to the Provider who requested this consultation."}  Other: {Blank multiple:19196:a:"***","samples provided of: ***","school forms provided","reviewed spirometry technique","reviewed inhaler technique"}  Thank you for your kind referral. I appreciate the opportunity to take part in Madisyn's care. Please do not hesitate to contact me with questions.***  Sincerely,  Tonny Bollman, MD [facility]

## 2023-08-12 ENCOUNTER — Ambulatory Visit: Payer: Self-pay | Admitting: Internal Medicine

## 2024-02-09 ENCOUNTER — Ambulatory Visit: Payer: 59 | Admitting: Medical

## 2024-02-09 VITALS — BP 116/80 | HR 103 | Temp 99.0°F | Resp 18 | Ht 65.0 in | Wt 172.0 lb

## 2024-02-09 DIAGNOSIS — R0989 Other specified symptoms and signs involving the circulatory and respiratory systems: Secondary | ICD-10-CM

## 2024-02-09 DIAGNOSIS — R0981 Nasal congestion: Secondary | ICD-10-CM

## 2024-02-09 DIAGNOSIS — J029 Acute pharyngitis, unspecified: Secondary | ICD-10-CM

## 2024-02-09 DIAGNOSIS — J3489 Other specified disorders of nose and nasal sinuses: Secondary | ICD-10-CM

## 2024-02-09 DIAGNOSIS — J069 Acute upper respiratory infection, unspecified: Secondary | ICD-10-CM | POA: Diagnosis not present

## 2024-02-09 MED ORDER — FLUTICASONE PROPIONATE 50 MCG/ACT NA SUSP: 2.0000 | Freq: Every day | NASAL | 1 refills | Status: AC

## 2024-02-09 MED ORDER — AZITHROMYCIN 250 MG PO TABS: ORAL_TABLET | ORAL | 0 refills | Status: AC

## 2024-02-09 MED ORDER — ALBUTEROL SULFATE HFA 108 (90 BASE) MCG/ACT IN AERS: 2.0000 | INHALATION_SPRAY | Freq: Four times a day (QID) | RESPIRATORY_TRACT | 0 refills | Status: DC | PRN

## 2024-02-09 MED ORDER — BENZONATATE 100 MG PO CAPS: 100.0000 mg | ORAL_CAPSULE | Freq: Three times a day (TID) | ORAL | 0 refills | Status: DC | PRN

## 2024-02-09 NOTE — Progress Notes (Signed)
 Subjective:    Patient ID: Kylie Lane, female    DOB: Apr 12, 1991, 33 y.o.   MRN: [id number]  HPI   Discussed the use of AI scribe software for clinical note transcription with the patient, who gave verbal consent to proceed.  History of Present Illness   Kylie Lane is a 33 year old female who presents with a deep cough and nasal congestion.  She has been experiencing a deep cough and significant nasal congestion for three days. The congestion is primarily located behind her nose, and her mucus turned green this morning. She has been taking Alka-Seltzer cold and flu medication every four hours to alleviate symptoms.  Her throat hurts mainly due to coughing, and she experiences some sinus pain. Occasionally, she feels a little chest congestion but denies any significant wheezing or shortness of breath, except when coughing. She has no history of asthma and does not smoke.  No fevers, chills, sweats, or body aches. She tested negative for COVID-19 yesterday. She is on birth control, which has stopped her menstrual cycles.         Lmp- on ocp.   Review of Systems  Constitutional:  Negative for chills, fatigue and fever.  HENT:  Positive for congestion, sinus pressure and sore throat. Negative for ear pain and sinus pain.   Respiratory:  Positive for cough and wheezing.        See hpi.  Cardiovascular:  Negative for chest pain and palpitations.  Gastrointestinal:  Negative for abdominal pain.  Genitourinary:  Negative for dysuria.  Musculoskeletal:  Negative for back pain, joint swelling and neck pain.  Skin:  Negative for rash.  Neurological:  Negative for dizziness, weakness and light-headedness.  Hematological:  Negative for adenopathy. Does not bruise/bleed easily.  Psychiatric/Behavioral:  Negative for behavioral problems and decreased concentration.     Past Medical History:  Diagnosis Date   Allergy    Anemia    Irritable bowel syndrome      Social History    Socioeconomic History   Marital status: Single    Spouse name: Not on file   Number of children: Not on file   Years of education: Not on file   Highest education level: Not on file  Occupational History   Not on file  Tobacco Use   Smoking status: Never   Smokeless tobacco: Never  Vaping Use   Vaping status: Never Used  Substance and Sexual Activity   Alcohol use: Yes    Comment: rare   Drug use: Never   Sexual activity: Not on file  Other Topics Concern   Not on file  Social History Narrative   Lives with mother, works [employer], material tech,heart healthy diet, busy work, no regular exercise routine, wears seat belt, no tobacco, single    Social Drivers of Corporate investment banker Strain: Not on file  Food Insecurity: Not on file  Transportation Needs: Not on file  Physical Activity: Not on file  Stress: Not on file  Social Connections: Not on file  Intimate Partner Violence: Low Risk  (07/09/2020)   Received from [facility]   Intimate Partner Violence    Insults You: Not on file    Threatens You: Not on file    Screams at You: Not on file    Physically Hurt: Not on file    Intimate Partner Violence Score: Not on file    Past Surgical History:  Procedure Laterality Date  COLONOSCOPY     WISDOM TOOTH EXTRACTION      Family History  Problem Relation Age of Onset   Hyperlipidemia Mother    Hypertension Mother    Arthritis Maternal Grandmother    Hyperlipidemia Maternal Grandmother    Stroke Maternal Grandmother    Hypertension Paternal Grandmother    Constipation Father    Colon cancer Neg Hx    Esophageal cancer Neg Hx    Pancreatic cancer Neg Hx    Stomach cancer Neg Hx    Liver disease Neg Hx     Allergies  Allergen Reactions   Shellfish Allergy Anaphylaxis    Current Outpatient Medications on File Prior to Visit  Medication Sig Dispense Refill   Multiple Vitamin (MULTIVITAMIN) tablet Take 1 tablet by mouth daily.      Polyethylene Glycol 3350 (MIRALAX PO) Take by mouth daily.     UNABLE TO FIND Med Name: Ferrofood bid     No current facility-administered medications on file prior to visit.    BP 116/80   Pulse (!) 103   Temp 99 F (37.2 C)   Resp 18   Ht 5\' 5"  (1.651 m)   Wt 172 lb (78 kg)   SpO2 100%   BMI 28.62 kg/m        Objective:   Physical Exam  General Mental Status- Alert. General Appearance- Not in acute distress.   Skin General: Color- Normal Color. Moisture- Normal Moisture.  Neck Carotid Arteries- Normal color. Moisture- Normal Moisture. No carotid bruits. No JVD.  Chest and Lung Exam Auscultation: Breath Sounds:-Normal.  Cardiovascular Auscultation:Rythm- Regular. Murmurs & Other Heart Sounds:Auscultation of the heart reveals- No Murmurs.  Abdomen Inspection:-Inspeection Normal. Palpation/Percussion:Note:No mass. Palpation and Percussion of the abdomen reveal- Non Tender, Non Distended + BS, no rebound or guarding.   Neurologic Cranial Nerve exam:- CN III-XII intact(No nystagmus), symmetric smile. Strength:- 5/5 equal and symmetric strength both upper and lower extremities.       Assessment & Plan:   Assessment and Plan    Upper Respiratory Infection Symptoms of deep cough, nasal congestion, and green mucus production started three days ago. No fever, chills, sweats, body aches, or wheezing. No history of asthma. Lungs sound clear on examination. -Start Flonase nasal spray for nasal congestion. -Start Benzonatate for cough. -Send in prescription for Azithromycin to [facility] at [address], to be used if symptoms worsen or if mucus production increases with cough.  Potential Wheezing rare when coughs. Reports chest tightness and potential wheezing with severe coughing. No history of needing inhalers when sick. Lungs clear presently. -Consider Albuterol inhaler if wheezing develops.  Follow-up in 10-14 days or sooner if symptoms worsen.        Esperanza Richters, PA-C

## 2024-02-09 NOTE — Patient Instructions (Signed)
 Upper Respiratory Infection Symptoms of deep cough, nasal congestion, and green mucus production started three days ago. No fever, chills, sweats, body aches, or wheezing. No history of asthma. Lungs sound clear on examination. -Start Flonase nasal spray for nasal congestion. -Start Benzonatate for cough. -Send in prescription for Azithromycin to [facility] at [address] and [address], to be used if symptoms worsen or if mucus production increases with cough.  Potential Wheezing rare when coughs. Reports chest tightness and potential wheezing with severe coughing. No history of needing inhalers when sick. Lungs clear presently. -Consider Albuterol inhaler if wheezing develops.  Follow-up in 10-14 days or sooner if symptoms worsen.

## 2024-07-25 ENCOUNTER — Telehealth: Payer: Self-pay

## 2024-07-25 DIAGNOSIS — Z114 Encounter for screening for human immunodeficiency virus [HIV]: Secondary | ICD-10-CM

## 2024-07-25 DIAGNOSIS — Z1159 Encounter for screening for other viral diseases: Secondary | ICD-10-CM

## 2024-07-25 NOTE — Telephone Encounter (Signed)
 Copied from CRM [id number]. Topic: Clinical - Lab/Test Results >> Jul 25, 2024  2:44 PM Frederich PARAS wrote: Reason for CRM: PT Calling to schedule labs and a full physical.patient wanst lab work done as well. I adv that labs would have to be ordered in order to have them scheduled.

## 2024-07-27 ENCOUNTER — Other Ambulatory Visit: Payer: Self-pay

## 2024-07-27 DIAGNOSIS — Z0000 Encounter for general adult medical examination without abnormal findings: Secondary | ICD-10-CM

## 2024-07-27 NOTE — Telephone Encounter (Signed)
 Called patient and no answer left vm to return call

## 2024-07-27 NOTE — Addendum Note (Signed)
 Addended by: Sativa Gelles C on: 07/27/2024 11:32 AM   Modules accepted: Orders

## 2024-07-27 NOTE — Telephone Encounter (Signed)
 Spoke with patient and she was scheduled and lab orders was placed for future.

## 2024-09-14 ENCOUNTER — Ambulatory Visit: Admitting: Family Medicine

## 2024-09-14 ENCOUNTER — Encounter: Payer: Self-pay | Admitting: Family Medicine

## 2024-09-14 VITALS — BP 118/76 | HR 86 | Temp 97.6°F | Ht 65.0 in | Wt 171.4 lb

## 2024-09-14 DIAGNOSIS — Z9109 Other allergy status, other than to drugs and biological substances: Secondary | ICD-10-CM

## 2024-09-14 LAB — POC COVID19 BINAXNOW: SARS Coronavirus 2 Ag: NEGATIVE

## 2024-09-14 MED ORDER — PREDNISONE 20 MG PO TABS: 20.0000 mg | ORAL_TABLET | Freq: Every day | ORAL | 0 refills | Status: DC

## 2024-09-14 NOTE — Progress Notes (Signed)
 [facility] [address] [place] [address] Dept: [phone] Dept Fax: [phone]  Office Visit  Subjective:    Patient ID: Kylie Lane, female    DOB: Mar 27, 1991, 33 y.o..   MRN: [id number]  Chief Complaint  Patient presents with   Sinus Problem    C/o having sinus drainage, ST, RT ear pain x 4 days.  Has taken Sudafed and Loratadine .    History of Present Illness:  Patient is in today complaining of a 4-day history of right ear fullness and discomfort, right jaw discomfort and down the right side of the throat. She has a history of chronic, lifelong allergies. She uses a daily fluticasone  nasal spray and takes loratadine . She has not been running fever. She denies any cough.  Past Medical History: Patient Active Problem List   Diagnosis Date Noted   Abdominal pain 11/22/2018   Preventative health care 04/03/2018   Constipation 03/30/2018   Environmental allergies 03/30/2018   Anemia 03/30/2018   Past Surgical History:  Procedure Laterality Date   COLONOSCOPY     WISDOM TOOTH EXTRACTION     Family History  Problem Relation Age of Onset   Hyperlipidemia Mother    Hypertension Mother    Arthritis Maternal Grandmother    Hyperlipidemia Maternal Grandmother    Stroke Maternal Grandmother    Hypertension Paternal Grandmother    Constipation Father    Colon cancer Neg Hx    Esophageal cancer Neg Hx    Pancreatic cancer Neg Hx    Stomach cancer Neg Hx    Liver disease Neg Hx    Outpatient Medications Prior to Visit  Medication Sig Dispense Refill   albuterol  (VENTOLIN  HFA) 108 (90 Base) MCG/ACT inhaler Inhale 2 puffs into the lungs every 6 (six) hours as needed. 18 g 0   fluticasone  (FLONASE ) 50 MCG/ACT nasal spray Place 2 sprays into both nostrils daily. 16 g 1   LARIN FE 1/20 1-20 MG-MCG tablet TAKE 1 TAB ORALLY DAILY SKIP PLACEBO TAKE ONLY ACTIVE PILLS. EVERY 3-4 MONTHS AND IF BREAKTHROUGH BLEED TAKE A  WEEK OF PLACEBO TO SCHEDULE PERIOD.     loratadine  (CLARITIN ) 10 MG tablet Take 10 mg by mouth.     Multiple Vitamin (MULTIVITAMIN) tablet Take 1 tablet by mouth daily.     Polyethylene Glycol 3350 (MIRALAX PO) Take by mouth daily.     benzonatate  (TESSALON ) 100 MG capsule Take 1 capsule (100 mg total) by mouth 3 (three) times daily as needed for cough. 30 capsule 0   UNABLE TO FIND Med Name: Ferrofood bid     No facility-administered medications prior to visit.   Allergies  Allergen Reactions   Shellfish Allergy  Anaphylaxis     Objective:   Today's Vitals   09/14/24 1255  BP: 118/76  Pulse: 86  Temp: 97.6 F (36.4 C)  TempSrc: Temporal  SpO2: 98%  Weight: 171 lb 6.4 oz (77.7 kg)  Height: 5' 5 (1.651 m)   Body mass index is 28.52 kg/m.   General: Well developed, well nourished. No acute distress. HEENT: Normocephalic, non-traumatic. PERRL, EOMI. Conjunctiva clear. External ears normal. EAC and   TMs normal bilaterally. Nose with moderate congestion with pale turbinates and moderate rhinorrhea.   Mucous membranes moist. Minor cobblestoning of posterior oropharynx. Good dentition. Neck: Supple. No lymphadenopathy. No thyromegaly. Lungs: Clear to auscultation bilaterally. No wheezing, rales or rhonchi. Psych: Alert and oriented. Normal mood and affect.  Health Maintenance Due  Topic  Date Due   HIV Screening  Never done   Hepatitis C Screening  Never done   HPV VACCINES (1 - 3-dose SCDM series) Never done     Lab Results: POCT Covid: Neg.  Assessment & Plan:   Problem List Items Addressed This Visit       Other   Environmental allergies - Primary   Symptoms and exam are consistent with a flare of Ms. Scioli's allergies. I do not see any sign of an infectious issue. I recommend she continue her use of Flonase  and loratadine . I will add a 5-day course of prednisone  20 mg daily.      Relevant Medications   loratadine  (CLARITIN ) 10 MG tablet   predniSONE  (DELTASONE )  20 MG tablet   Other Relevant Orders   POC COVID-19 (Completed)    Return if symptoms worsen or fail to improve.   Garnette CHRISTELLA Simpler, MD

## 2024-09-14 NOTE — Assessment & Plan Note (Signed)
 Symptoms and exam are consistent with a flare of Kylie Lane's allergies. I do not see any sign of an infectious issue. I recommend she continue her use of Flonase  and loratadine . I will add a 5-day course of prednisone  20 mg daily.

## 2024-09-19 ENCOUNTER — Other Ambulatory Visit

## 2024-09-22 ENCOUNTER — Other Ambulatory Visit: Payer: Self-pay | Admitting: Medical Genetics

## 2024-09-25 NOTE — Assessment & Plan Note (Deleted)
Increase leafy greens, consider increased lean red meat and using cast iron cookware. Continue to monitor, report any concerns. stable 

## 2024-09-26 ENCOUNTER — Other Ambulatory Visit (INDEPENDENT_AMBULATORY_CARE_PROVIDER_SITE_OTHER)

## 2024-09-26 DIAGNOSIS — Z0000 Encounter for general adult medical examination without abnormal findings: Secondary | ICD-10-CM

## 2024-09-26 DIAGNOSIS — Z1159 Encounter for screening for other viral diseases: Secondary | ICD-10-CM | POA: Diagnosis not present

## 2024-09-26 DIAGNOSIS — Z114 Encounter for screening for human immunodeficiency virus [HIV]: Secondary | ICD-10-CM

## 2024-09-26 LAB — COMPREHENSIVE METABOLIC PANEL WITH GFR
ALT: 10 U/L (ref 0–35)
AST: 10 U/L (ref 0–37)
Albumin: 4.2 g/dL (ref 3.5–5.2)
Alkaline Phosphatase: 32 U/L — ABNORMAL LOW (ref 39–117)
BUN: 9 mg/dL (ref 6–23)
CO2: 23 meq/L (ref 19–32)
Calcium: 9.1 mg/dL (ref 8.4–10.5)
Chloride: 104 meq/L (ref 96–112)
Creatinine, Ser: 0.63 mg/dL (ref 0.40–1.20)
GFR: 118.4 mL/min (ref >60.00)
Glucose, Bld: 87 mg/dL (ref 70–99)
Potassium: 3.6 meq/L (ref 3.5–5.1)
Sodium: 138 meq/L (ref 135–145)
Total Bilirubin: 0.3 mg/dL (ref 0.2–1.2)
Total Protein: 6.9 g/dL (ref 6.0–8.3)

## 2024-09-26 LAB — CBC WITH DIFFERENTIAL/PLATELET
Basophils Absolute: 0.1 K/uL (ref 0.0–0.1)
Basophils Relative: 0.8 % (ref 0.0–3.0)
Eosinophils Absolute: 0.3 K/uL (ref 0.0–0.7)
Eosinophils Relative: 3.5 % (ref 0.0–5.0)
HCT: 36.2 % (ref 36.0–46.0)
Hemoglobin: 12.2 g/dL (ref 12.0–15.0)
Lymphocytes Relative: 43.1 % (ref 12.0–46.0)
Lymphs Abs: 3.1 K/uL (ref 0.7–4.0)
MCHC: 33.8 g/dL (ref 30.0–36.0)
MCV: 91.4 fl (ref 78.0–100.0)
Monocytes Absolute: 0.4 K/uL (ref 0.1–1.0)
Monocytes Relative: 5.6 % (ref 3.0–12.0)
Neutro Abs: 3.4 K/uL (ref 1.4–7.7)
Neutrophils Relative %: 47 % (ref 43.0–77.0)
Platelets: 301 K/uL (ref 150.0–400.0)
RBC: 3.96 Mil/uL (ref 3.87–5.11)
RDW: 12.8 % (ref 11.5–15.5)
WBC: 7.2 K/uL (ref 4.0–10.5)

## 2024-09-26 LAB — TSH: TSH: 2.57 u[IU]/mL (ref 0.35–5.50)

## 2024-09-27 ENCOUNTER — Ambulatory Visit: Payer: Self-pay | Admitting: Family Medicine

## 2024-09-27 LAB — LIPID PANEL
Cholesterol: 194 mg/dL (ref <200)
HDL: 67 mg/dL (ref >=50)
LDL Cholesterol (Calc): 106 mg/dL — ABNORMAL HIGH
Non-HDL Cholesterol (Calc): 127 mg/dL (ref <130)
Total CHOL/HDL Ratio: 2.9 (calc) (ref <5.0)
Triglycerides: 111 mg/dL (ref <150)

## 2024-09-27 LAB — HEPATITIS C ANTIBODY: Hepatitis C Ab: NONREACTIVE

## 2024-09-27 LAB — HIV ANTIBODY (ROUTINE TESTING W REFLEX)
HIV 1&2 Ab, 4th Generation: NONREACTIVE
HIV FINAL INTERPRETATION: NEGATIVE

## 2024-09-29 ENCOUNTER — Encounter: Admitting: Family Medicine

## 2024-09-29 DIAGNOSIS — D509 Iron deficiency anemia, unspecified: Secondary | ICD-10-CM

## 2024-11-21 ENCOUNTER — Other Ambulatory Visit

## 2025-01-11 ENCOUNTER — Ambulatory Visit: Admitting: Internal Medicine

## 2025-01-11 ENCOUNTER — Encounter: Payer: Self-pay | Admitting: Internal Medicine

## 2025-01-11 VITALS — BP 118/78 | HR 94 | Temp 97.3°F | Resp 16 | Ht 66.0 in | Wt 177.0 lb

## 2025-01-11 DIAGNOSIS — J310 Chronic rhinitis: Secondary | ICD-10-CM

## 2025-01-11 DIAGNOSIS — R221 Localized swelling, mass and lump, neck: Secondary | ICD-10-CM

## 2025-01-11 DIAGNOSIS — T7800XA Anaphylactic reaction due to unspecified food, initial encounter: Secondary | ICD-10-CM

## 2025-01-11 DIAGNOSIS — L308 Other specified dermatitis: Secondary | ICD-10-CM

## 2025-01-11 DIAGNOSIS — L309 Dermatitis, unspecified: Secondary | ICD-10-CM | POA: Insufficient documentation

## 2025-01-11 MED ORDER — TRIAMCINOLONE ACETONIDE 0.1 % EX OINT: TOPICAL_OINTMENT | CUTANEOUS | 1 refills | Status: AC

## 2025-01-11 MED ORDER — NEFFY 2 MG/0.1ML NA SOLN: 1.0000 | NASAL | 1 refills | Status: AC | PRN

## 2025-01-11 MED ORDER — OLOPATADINE HCL 0.2 % OP SOLN: 1.0000 [drp] | Freq: Two times a day (BID) | OPHTHALMIC | 5 refills | Status: AC | PRN

## 2025-01-11 MED ORDER — AZELASTINE HCL 0.1 % NA SOLN: 2.0000 | Freq: Two times a day (BID) | NASAL | 5 refills | Status: AC | PRN

## 2025-01-11 NOTE — Progress Notes (Signed)
 "  NEW PATIENT Date of Service/Encounter:   01/11/2025 Referring provider: Domenica Harlene LABOR, MD Primary care provider: Domenica Harlene LABOR, MD  Subjective:  Kylie Lane is a 34 y.o. female with a PMHx of IBS presenting today for evaluation of chronic rhinitis, atopic dermatitis and concern for food allergies.  History obtained from: chart review and patient.   Discussed the use of AI scribe software for clinical note transcription with the patient, who gave concern for shellfish allergy .verbal consent to proceed.  History of Present Illness Kylie Lane is a 34 year old female with environmental and food allergies who presents for evaluation and management of her allergies.  Allergic rhinoconjunctivitis and pruritus - Lifelong environmental allergies with seasonal exacerbations in spring and fall - Symptoms include sneezing, puffy and runny eyes, itchy throat and tongue - Severe pruritus leads to excoriations and scabbing, especially with pollen exposure  Eczematous dermatitis - Eczema flares associated with environmental allergen exposure - Severe itching resulting in scabs from scratching - Previously used a topical cream for eczema, but no longer has it - Areas affected include bends of arms and shoulders  Pharmacologic management of allergic symptoms - Uses Claritin  and Flonase  for allergy  management - Alternates annually between Claritin  and Zyrtec  without significant difference in symptom control - Describes feeling 'almost immune' to these medications - Uses Flonase  daily, one spray - Experienced grogginess with Xyzal  and prefers to avoid it - Does not currently use any eye drops but is considering Pataday   Food-induced anaphylactoid symptoms - Throat closing sensation with shellfish, specifically shrimp and anchovies - Similar reactions to other seafood, including salmon - Avoids all seafood - Previously prescribed EpiPens, which are now expired     Chart Review:   Reviewed PCP notes 09/14/24: allergic rhinitis flare, recommended claritin , flonase  and prescribed prednisone  20 mg x 5 days   Past Medical History: Past Medical History:  Diagnosis Date   Allergy     Anemia    Eczema    Irritable bowel syndrome    Medication List:  Current Outpatient Medications  Medication Sig Dispense Refill   fluticasone  (FLONASE ) 50 MCG/ACT nasal spray Place 2 sprays into both nostrils daily. 16 g 1   LARIN FE 1/20 1-20 MG-MCG tablet TAKE 1 TAB ORALLY DAILY SKIP PLACEBO TAKE ONLY ACTIVE PILLS. EVERY 3-4 MONTHS AND IF BREAKTHROUGH BLEED TAKE A WEEK OF PLACEBO TO SCHEDULE PERIOD.     loratadine  (CLARITIN ) 10 MG tablet Take 10 mg by mouth.     Multiple Vitamin (MULTIVITAMIN) tablet Take 1 tablet by mouth daily.     Polyethylene Glycol 3350 (MIRALAX PO) Take by mouth daily.     No current facility-administered medications for this visit.   Known Allergies:  Allergies[1] Past Surgical History: Past Surgical History:  Procedure Laterality Date   COLONOSCOPY     NASAL SINUS SURGERY  04/2022   WISDOM TOOTH EXTRACTION     Family History: Family History  Problem Relation Age of Onset   Eczema Mother    Allergic rhinitis Mother    Hyperlipidemia Mother    Hypertension Mother    Food Allergy  Mother    Sinusitis Mother    Sinusitis Father    Allergic rhinitis Father    Constipation Father    Sinusitis Maternal Grandmother    Allergic rhinitis Maternal Grandmother    Arthritis Maternal Grandmother    Hyperlipidemia Maternal Grandmother    Stroke Maternal Grandmother    Hypertension Paternal Grandmother    Allergic rhinitis  Paternal Grandfather    Sinusitis Paternal Grandfather    Colon cancer Neg Hx    Esophageal cancer Neg Hx    Pancreatic cancer Neg Hx    Stomach cancer Neg Hx    Liver disease Neg Hx    Asthma Neg Hx    Social History: Sariah lives in an [place]. 34 years old, no water damage, hardwood floors, electrocuting, central AC, no pets, no  roaches, not using dust mite covers in the bed of the pillows.  Works in [employer].  No HEPA filter in the home.  [place]   ROS:  All other systems negative except as noted per HPI.  Objective:  Blood pressure 118/78, pulse 94, temperature (!) 97.3 F (36.3 C), temperature source Temporal, resp. rate 16, height 5' 6 (1.676 m), weight 177 lb (80.3 kg), SpO2 99%. Body mass index is 28.57 kg/m. Physical Exam:  General Appearance:  Alert, cooperative, no distress, appears stated age  Head:  Normocephalic, without obvious abnormality, atraumatic  Eyes:  Conjunctiva clear, EOM's intact  Ears EACs normal bilaterally and normal TMs bilaterally  Nose: Nares normal, hypertrophic turbinates, normal mucosa, and no visible anterior polyps  Throat: Lips, tongue normal; teeth and gums normal, normal posterior oropharynx  Neck: Supple, symmetrical  Lungs:   clear to auscultation bilaterally, Respirations unlabored, no coughing  Heart:  regular rate and rhythm and no murmur, Appears well perfused  Extremities: No edema  Skin: Skin color, texture, turgor normal and no rashes or lesions on visualized portions of skin  Neurologic: No gross deficits   Diagnostics:  Labs:  Lab Orders  No laboratory test(s) ordered today     Assessment and Plan  Assessment and Plan Assessment & Plan 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 Previous EpiPens have expired. Prefers nasal spray over autoinjector for emergency use. - Ordered nasal spray epinephrine  autoinjector - Scheduled allergy  testing for shellfish and fish - Instructed to avoid Claritin , Zyrtec , Allegra, and new nasal spray for three days prior to testing - Emergency action plan provided and reviewed. - labs for achovy at follow-up  Allergic rhinitis, perennial and seasonal Chronic allergic rhinitis with symptoms of sneezing, puffy and  runny eyes, and itchy throat, exacerbated in spring and fall. Current management with Claritin  and Flonase  is effective. Xyzal  caused grogginess, so Allegra is preferred. Azelastine  nasal spray suggested for additional control on bad days. - Continue Claritin  10 mg daily and Flonase  1-2 sprays daily - Prescribed azelastine  nasal spray as needed, 2 sprays twice daily as needed - Recommended Pataday  or Zaditor eye drops for ocular symptoms - Scheduled allergy  testing  Atopic dermatitis Eczema flares primarily during pollen season, affecting arms, elbows, and shoulders. Previous use of triamcinolone  cream. Advised to use fragrance-free and dye-free products to prevent skin irritation. Daily Care For Maintenance (daily and continue even once eczema controlled) - Use hypoallergenic hydrating ointment at least twice daily.  This must be done daily for control of flares. (Great options include Vaseline, CeraVe, Aquaphor, Aveeno, Cetaphil, VaniCream, etc) - Avoid detergents, soaps or lotions with fragrances/dyes - Limit showers/baths to 5 minutes and use luke warm water instead of hot, pat dry following baths, and apply moisturizer - can use steroid/non-steroid therapy creams as detailed below up to twice weekly for prevention of flares.  For Flares:(add this to maintenance therapy if needed for flares) First apply steroid/non-steroid treatment creams. Wait 5 minutes then apply moisturizer.  -  Triamcinolone  0.1% to body for moderate flares-apply topically twice daily to red, raised areas of skin, followed by moisturizer. Do NOT use on face, groin or armpits.   Follow up : next Tuesday, January 27th at 8:30 AM (1-55, shellfish and fish). Stop antihistamines 3 days prior to visit.  It was a pleasure meeting you in clinic today! Thank you for allowing me to participate in your care.  Rocky Endow, MD [facility]    This note in its entirety was forwarded to the Provider who  requested this consultation.  Other: none  Thank you for your kind referral. I appreciate the opportunity to take part in Deni's care. Please do not hesitate to contact me with questions.  Sincerely,  Rocky Endow, MD [facility]          [1]  Allergies Allergen Reactions   Shellfish Allergy  Anaphylaxis   "

## 2025-01-11 NOTE — Patient Instructions (Addendum)
 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 Previous EpiPens have expired. Prefers nasal spray over autoinjector for emergency use. - Ordered nasal spray epinephrine  autoinjector - Scheduled allergy  testing for shellfish and fish - Instructed to avoid Claritin , Zyrtec , Allegra, and new nasal spray for three days prior to testing - Emergency action plan provided and reviewed. - labs for achovy at follow-up  Allergic rhinitis, perennial and seasonal Chronic allergic rhinitis with symptoms of sneezing, puffy and runny eyes, and itchy throat, exacerbated in spring and fall. Current management with Claritin  and Flonase  is effective. Xyzal  caused grogginess, so Allegra is preferred. Azelastine  nasal spray suggested for additional control on bad days. - Continue Claritin  10 mg daily and Flonase  1-2 sprays daily - Prescribed azelastine  nasal spray as needed, 2 sprays twice daily as needed - Recommended Pataday  or Zaditor eye drops for ocular symptoms - Scheduled allergy  testing  Atopic dermatitis Eczema flares primarily during pollen season, affecting arms, elbows, and shoulders. Previous use of triamcinolone  cream. Advised to use fragrance-free and dye-free products to prevent skin irritation. Daily Care For Maintenance (daily and continue even once eczema controlled) - Use hypoallergenic hydrating ointment at least twice daily.  This must be done daily for control of flares. (Great options include Vaseline, CeraVe, Aquaphor, Aveeno, Cetaphil, VaniCream, etc) - Avoid detergents, soaps or lotions with fragrances/dyes - Limit showers/baths to 5 minutes and use luke warm water instead of hot, pat dry following baths, and apply moisturizer - can use steroid/non-steroid therapy creams as detailed below up to twice weekly for prevention of flares.  For Flares:(add this to maintenance therapy if needed for flares) First apply  steroid/non-steroid treatment creams. Wait 5 minutes then apply moisturizer.  - Triamcinolone  0.1% to body for moderate flares-apply topically twice daily to red, raised areas of skin, followed by moisturizer. Do NOT use on face, groin or armpits.   Follow up : next Tuesday, January 27th at 8:30 AM (1-55, shellfish and fish). Stop antihistamines 3 days prior to visit.  It was a pleasure meeting you in clinic today! Thank you for allowing me to participate in your care.  Rocky Endow, MD [facility]

## 2025-01-17 ENCOUNTER — Ambulatory Visit: Admitting: Internal Medicine

## 2025-01-20 ENCOUNTER — Encounter: Payer: Self-pay | Admitting: Internal Medicine

## 2025-01-20 ENCOUNTER — Ambulatory Visit: Admitting: Internal Medicine

## 2025-01-20 DIAGNOSIS — J3089 Other allergic rhinitis: Secondary | ICD-10-CM | POA: Diagnosis not present

## 2025-01-20 DIAGNOSIS — R221 Localized swelling, mass and lump, neck: Secondary | ICD-10-CM

## 2025-01-20 DIAGNOSIS — T7800XD Anaphylactic reaction due to unspecified food, subsequent encounter: Secondary | ICD-10-CM

## 2025-01-20 DIAGNOSIS — T7800XA Anaphylactic reaction due to unspecified food, initial encounter: Secondary | ICD-10-CM | POA: Insufficient documentation

## 2025-01-20 DIAGNOSIS — J302 Other seasonal allergic rhinitis: Secondary | ICD-10-CM | POA: Diagnosis not present

## 2025-01-20 NOTE — Patient Instructions (Signed)
 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 Previous EpiPens have expired. Prefers nasal spray over autoinjector for emergency use. - Ordered nasal spray epinephrine  autoinjector - Skin prick testing 01/20/25 positive to fish mix but not individual fish, borderline positive to crab and lobster, negative other shellfish and shellfish mix; labs for confirmation - Emergency action plan up-to-date - continue avoiding shellfish and fish - labs for achovy at follow-up  Allergic rhinitis, perennial and seasonal Chronic allergic rhinitis with symptoms of sneezing, puffy and runny eyes, and itchy throat, exacerbated in spring and fall. Current management with Claritin  and Flonase  is effective. Xyzal  caused grogginess, so Allegra is preferred. Azelastine  nasal spray suggested for additional control on bad days. - Continue Claritin  10 mg daily and Flonase  1-2 sprays daily - Azelastine  nasal spray as needed, 2 sprays twice daily as needed - Recommended Pataday  or Zaditor eye drops for ocular symptoms - Skin testing for environmental allergies 01/20/2025: Positive to grass pollen, weed pollen, tree pollen, minor molds, dust mite, dog, cockroach, intradermal testing positive to indoor molds; allergen avoidance.  -Consider allergy  injections to reduce lifetime symptoms and need for medications by teaching your immune system to become tolerant of the environmental allergens you are allergic to  Atopic dermatitis Eczema flares primarily during pollen season, affecting arms, elbows, and shoulders. Previous use of triamcinolone  cream. Advised to use fragrance-free and dye-free products to prevent skin irritation. Daily Care For Maintenance (daily and continue even once eczema controlled) - Use hypoallergenic hydrating ointment at least twice daily.  This must be done daily for control of flares. (Great options include Vaseline, CeraVe,  Aquaphor, Aveeno, Cetaphil, VaniCream, etc) - Avoid detergents, soaps or lotions with fragrances/dyes - Limit showers/baths to 5 minutes and use luke warm water instead of hot, pat dry following baths, and apply moisturizer - can use steroid/non-steroid therapy creams as detailed below up to twice weekly for prevention of flares.  For Flares:(add this to maintenance therapy if needed for flares) First apply steroid/non-steroid treatment creams. Wait 5 minutes then apply moisturizer.  - Triamcinolone  0.1% to body for moderate flares-apply topically twice daily to red, raised areas of skin, followed by moisturizer. Do NOT use on face, groin or armpits.   Follow up : 3 months, sooner if elects to do allergy  injections-would recommend RUSH (rapid build-up) It was a pleasure seeing you again in clinic today! Thank you for allowing me to participate in your care.  Rocky Endow, MD [facility]  Reducing Pollen Exposure  The American Academy of Allergy , Asthma and Immunology suggests the following steps to reduce your exposure to pollen during allergy  seasons.    Do not hang sheets or clothing out to dry; pollen may collect on these items. Do not mow lawns or spend time around freshly cut grass; mowing stirs up pollen. Keep windows closed at night.  Keep car windows closed while driving. Minimize morning activities outdoors, a time when pollen counts are usually at their highest. Stay indoors as much as possible when pollen counts or humidity is high and on windy days when pollen tends to remain in the air longer. Use air conditioning when possible.  Many air conditioners have filters that trap the pollen spores. Use a HEPA room air filter to remove pollen form the indoor air you breathe. Control of Mold Allergen   Mold and fungi can grow on a variety of surfaces provided certain temperature and moisture conditions  exist.  Outdoor molds grow on plants, decaying vegetation and  soil.  The major outdoor mold, Alternaria and Cladosporium, are found in very high numbers during hot and dry conditions.  Generally, a late Summer - Fall peak is seen for common outdoor fungal spores.  Rain will temporarily lower outdoor mold spore count, but counts rise rapidly when the rainy period ends.  The most important indoor molds are Aspergillus and Penicillium.  Dark, humid and poorly ventilated basements are ideal sites for mold growth.  The next most common sites of mold growth are the bathroom and the kitchen.  Outdoor (Seasonal) Mold Control  Use air conditioning and keep windows closed Avoid exposure to decaying vegetation. Avoid leaf raking. Avoid grain handling. Consider wearing a face mask if working in moldy areas.    Indoor (Perennial) Mold Control   Maintain humidity below 50%. Clean washable surfaces with 5% bleach solution. Remove sources e.g. contaminated carpets.   DUST MITE AVOIDANCE MEASURES:  There are three main measures that need and can be taken to avoid house dust mites:  Reduce accumulation of dust in general -reduce furniture, clothing, carpeting, books, stuffed animals, especially in bedroom  Separate yourself from the dust -use pillow and mattress encasements (can be found at stores such as [employer] or online) -avoid direct exposure to air condition flow -use a HEPA filter device, especially in the bedroom; you can also use a HEPA filter vacuum cleaner -wipe dust with a moist towel instead of a dry towel or broom when cleaning  Decrease mites and/or their secretions -wash clothing and linen and stuffed animals at highest temperature possible, at least every 2 weeks -stuffed animals can also be placed in a bag and put in a freezer overnight  Despite the above measures, it is impossible to eliminate dust mites or their allergen completely from your home.  With the above measures the burden of mites in your home can be diminished, with  the goal of minimizing your allergic symptoms.  Success will be reached only when implementing and using all means together. Control of Dog or Cat Allergen  Avoidance is the best way to manage a dog or cat allergy . If you have a dog or cat and are allergic to dog or cats, consider removing the dog or cat from the home. If you have a dog or cat but dont want to find it a new home, or if your family wants a pet even though someone in the household is allergic, here are some strategies that may help keep symptoms at bay:  Keep the pet out of your bedroom and restrict it to only a few rooms. Be advised that keeping the dog or cat in only one room will not limit the allergens to that room. Dont pet, hug or kiss the dog or cat; if you do, wash your hands with soap and water. High-efficiency particulate air (HEPA) cleaners run continuously in a bedroom or living room can reduce allergen levels over time. Regular use of a high-efficiency vacuum cleaner or a central vacuum can reduce allergen levels. Giving your dog or cat a bath at least once a week can reduce airborne allergen. Control of Cockroach Allergen  Cockroach allergen has been identified as an important cause of acute attacks of asthma, especially in urban settings.  There are fifty-five species of cockroach that exist in the [place] , however only three, the American, German and Oriental species produce allergen that can affect patients with Asthma.  Allergens can be obtained from fecal particles, egg casings and secretions from cockroaches.    Remove food sources. Reduce access to water. Seal access and entry points. Spray runways with 0.5-1% Diazinon or Chlorpyrifos Blow boric acid power under stoves and refrigerator. Place bait stations (hydramethylnon) at feeding sites.

## 2025-01-20 NOTE — Progress Notes (Signed)
 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  Please see that note for additional details.  Today reports for allergy  diagnostic testing:    DIAGNOSTICS:  Skin Testing: Environmental allergy  panel and select foods. Adequate positive and negative controls. Results discussed with patient/family.  Airborne Adult Perc - 01/20/25 1357     Time Antigen Placed 0200    Allergen Manufacturer Jestine    Number of Test 55    1. Control-Buffer 50% Glycerol Negative    2. Control-Histamine 3+    3. Bahia 3+    4. Bermuda 3+    5. Johnson 3+    6. [place]  Blue 3+    7. Meadow Fescue 4+    8. Perennial Rye 4+    9. Timothy 4+    10. Ragweed Mix 3+    11. Cocklebur 3+    12. Plantain,  English 3+    13. Baccharis 3+    14. Dog Fennel 2+    15. Russian Thistle 2+    16. Lamb's Quarters 2+    17. Sheep Sorrell 3+    18. Rough Pigweed 3+    19. Marsh Elder, Rough 3+    20. Mugwort, Common 2+    21. Box, Elder Negative    22. Cedar, red Negative    23. Sweet Gum 2+    24. Pecan Pollen 3+    25. Pine Mix 2+    26. Walnut, Black Pollen 2+    27. Red Mulberry 3+    28. Ash Mix 2+    29. Birch Mix 3+    30. Beech American 3+    31. Cottonwood, Eastern Negative    32. Hickory, White Negative    33. Maple Mix Negative    34. Oak, Eastern Mix Negative    35. Sycamore Eastern Negative    36. Alternaria Alternata Negative    37. Cladosporium Herbarum Negative    38. Aspergillus Mix Negative    40. Bipolaris Sorokiniana (Helminthosporium) Negative    41. Drechslera Spicifera (Curvularia) 3+    42. Mucor Plumbeus Negative    43. Fusarium Moniliforme 2+    44. Aureobasidium Pullulans (pullulara) 2+    45. Rhizopus Oryzae Negative    46. Botrytis Cinera Negative    47. Epicoccum Nigrum Negative    48. Phoma Betae 3+    49. Dust Mite Mix 3+     50. Cat Hair 10,000 BAU/ml Negative    51.  Dog Epithelia 3+    52. Mixed Feathers Negative    53. Horse Epithelia Negative    54. Cockroach, German 3+    55. Tobacco Leaf Negative          Intradermal - 01/20/25 1451     Time Antigen Placed 1451    Allergen Manufacturer Jestine    Location Back    Number of Test 4    Control Negative    Bahia Omitted    Bermuda Omitted    Johnson Omitted    7 Grass Omitted    Ragweed Mix Omitted    Weed Mix Omitted    Tree Mix Omitted    Mold 1 Negative    Mold 2 3+    Mold 3 Omitted    Mold 4 Omitted    Mite Mix Omitted    Cat Negative    Dog  Omitted    Cockroach Omitted    Other Omitted          Food Adult Perc - 01/20/25 1400     Time Antigen Placed 0200    Allergen Manufacturer Greer    Location Back    Number of allergen test 12     Control-buffer 50% Glycerol Negative    Control-Histamine 3+    8. Shellfish Mix Negative    9. Fish Mix Negative   5x10   18. Trout Negative    19. Tuna Negative    20. Salmon Negative    21. Flounder Negative    22. Codfish Negative    23. Shrimp Negative    24. Crab Negative    25. Lobster --   4x5   26. Oyster Negative   4x5         Allergy  testing results were read and interpreted by myself, documented by clinical staff.  Patient provided with copy of allergy  testing along with avoidance measures when indicated.   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 Previous EpiPens have expired. Prefers nasal spray over autoinjector for emergency use. - Ordered nasal spray epinephrine  autoinjector - Skin prick testing 01/20/25 positive to fish mix but not individual fish, borderline positive to crab and lobster, negative other shellfish and shellfish mix; labs for confirmation - Emergency action plan up-to-date -  continue avoiding shellfish and fish - labs for achovy at follow-up  Allergic rhinitis, perennial and seasonal Chronic allergic rhinitis with symptoms of sneezing, puffy and runny eyes, and itchy throat, exacerbated in spring and fall. Current management with Claritin  and Flonase  is effective. Xyzal  caused grogginess, so Allegra is preferred. Azelastine  nasal spray suggested for additional control on bad days. - Continue Claritin  10 mg daily and Flonase  1-2 sprays daily - Azelastine  nasal spray as needed, 2 sprays twice daily as needed - Recommended Pataday  or Zaditor eye drops for ocular symptoms - Skin testing for environmental allergies 01/20/2025: Positive to grass pollen, weed pollen, tree pollen, minor molds, dust mite, dog, cockroach, intradermal testing positive to indoor molds; allergen avoidance.  -Consider allergy  injections to reduce lifetime symptoms and need for medications by teaching your immune system to become tolerant of the environmental allergens you are allergic to  Atopic dermatitis Eczema flares primarily during pollen season, affecting arms, elbows, and shoulders. Previous use of triamcinolone  cream. Advised to use fragrance-free and dye-free products to prevent skin irritation. Daily Care For Maintenance (daily and continue even once eczema controlled) - Use hypoallergenic hydrating ointment at least twice daily.  This must be done daily for control of flares. (Great options include Vaseline, CeraVe, Aquaphor, Aveeno, Cetaphil, VaniCream, etc) - Avoid detergents, soaps or lotions with fragrances/dyes - Limit showers/baths to 5 minutes and use luke warm water instead of hot, pat dry following baths, and apply moisturizer - can use steroid/non-steroid therapy creams as detailed below up to twice weekly for prevention of flares.  For Flares:(add this to maintenance therapy if needed for flares) First apply steroid/non-steroid treatment creams. Wait 5 minutes then apply  moisturizer.  - Triamcinolone  0.1% to body for moderate flares-apply topically twice daily to red, raised areas of skin, followed by moisturizer. Do NOT use on face, groin or armpits.   Follow up : 3 months, sooner if elects to do allergy  injections-would recommend RUSH (rapid build-up) It was a  pleasure seeing you again in clinic today! Thank you for allowing me to participate in your care.  Rocky Endow, MD [facility]      "

## 2025-01-26 ENCOUNTER — Ambulatory Visit: Payer: Self-pay | Admitting: Internal Medicine

## 2025-01-26 LAB — ALLERGEN PROFILE, SHELLFISH
Clam IgE: 0.23 kU/L — AB
F023-IgE Crab: 0.28 kU/L — AB
F080-IgE Lobster: 0.13 kU/L — AB
F290-IgE Oyster: <0.1 kU/L
Scallop IgE: 0.3 kU/L — AB
Shrimp IgE: 1.02 kU/L — AB

## 2025-01-26 LAB — ALLERGEN PROFILE, FOOD-FISH
Allergen Mackerel IgE: <0.1 kU/L
Allergen Salmon IgE: <0.1 kU/L
Allergen Trout IgE: <0.1 kU/L
Allergen Walley Pike IgE: <0.1 kU/L
Codfish IgE: <0.1 kU/L
Halibut IgE: <0.1 kU/L
Tuna: <0.1 kU/L

## 2025-01-26 LAB — F313-IGE ANCHOVY: F313-IgE Anchovy: <0.1 kU/L

## 2025-01-26 NOTE — Progress Notes (Signed)
 Please let Kylie Lane know that her lab work is positive to shellfish only, negative to fish including salmon and anchovy.  It is possible that her reactions with salmon and anchovy were due to shellfish contamination.  I am happy to offer a challenge to these if she has interest in reintroducing.  In the meantime, avoid shellfish, salmon and anchovy.  Food challenge instructions: You must be off antihistamines for 3-5 days before. Must be in good health and not ill. No vaccines/injections/antibiotics within the past 7 days. Plan on being in the office for 2-4 hours and must bring in the food you want to do the oral challenge for.
